# Patient Record
Sex: Female | Born: 1965 | Race: White | Hispanic: No | Marital: Married | Smoking: Current every day smoker
Health system: Southern US, Community
[De-identification: ages and names within clinical notes are randomized; demographics above are authoritative.]

## PROBLEM LIST (undated history)

## (undated) DIAGNOSIS — G9050 Complex regional pain syndrome I, unspecified: Secondary | ICD-10-CM

## (undated) DIAGNOSIS — F32A Depression, unspecified: Secondary | ICD-10-CM

## (undated) DIAGNOSIS — E278 Other specified disorders of adrenal gland: Secondary | ICD-10-CM

## (undated) DIAGNOSIS — I10 Essential (primary) hypertension: Secondary | ICD-10-CM

## (undated) DIAGNOSIS — K219 Gastro-esophageal reflux disease without esophagitis: Secondary | ICD-10-CM

## (undated) DIAGNOSIS — F419 Anxiety disorder, unspecified: Secondary | ICD-10-CM

## (undated) DIAGNOSIS — E279 Disorder of adrenal gland, unspecified: Secondary | ICD-10-CM

## (undated) DIAGNOSIS — T7840XA Allergy, unspecified, initial encounter: Secondary | ICD-10-CM

## (undated) DIAGNOSIS — F4310 Post-traumatic stress disorder, unspecified: Secondary | ICD-10-CM

## (undated) DIAGNOSIS — M797 Fibromyalgia: Secondary | ICD-10-CM

## (undated) HISTORY — DX: Allergy, unspecified, initial encounter: T78.40XA

## (undated) HISTORY — DX: Fibromyalgia: M79.7

## (undated) HISTORY — PX: ABDOMINAL HYSTERECTOMY: SHX81

## (undated) HISTORY — PX: GASTRIC BYPASS: SHX52

## (undated) HISTORY — DX: Post-traumatic stress disorder, unspecified: F43.10

## (undated) HISTORY — DX: Other specified disorders of adrenal gland: E27.8

## (undated) HISTORY — PX: CHOLECYSTECTOMY: SHX55

## (undated) HISTORY — DX: Disorder of adrenal gland, unspecified: E27.9

## (undated) HISTORY — DX: Anxiety disorder, unspecified: F41.9

## (undated) HISTORY — PX: TONSILLECTOMY: SUR1361

## (undated) HISTORY — PX: APPENDECTOMY: SHX54

## (undated) HISTORY — PX: FOOT SURGERY: SHX648

## (undated) HISTORY — DX: Depression, unspecified: F32.A

## (undated) HISTORY — DX: Complex regional pain syndrome I, unspecified: G90.50

---

## 2009-01-18 ENCOUNTER — Ambulatory Visit: Payer: Self-pay | Admitting: Internal Medicine

## 2009-05-22 ENCOUNTER — Ambulatory Visit: Payer: Self-pay | Admitting: Internal Medicine

## 2009-06-16 ENCOUNTER — Encounter: Payer: Self-pay | Admitting: Specialist

## 2009-06-17 ENCOUNTER — Encounter: Payer: Self-pay | Admitting: Specialist

## 2009-07-02 ENCOUNTER — Ambulatory Visit: Payer: Self-pay | Admitting: Podiatry

## 2009-07-29 ENCOUNTER — Ambulatory Visit: Payer: Self-pay | Admitting: Podiatry

## 2009-08-24 ENCOUNTER — Ambulatory Visit: Payer: Self-pay | Admitting: Podiatry

## 2009-09-14 ENCOUNTER — Ambulatory Visit: Payer: Self-pay | Admitting: Internal Medicine

## 2009-11-02 ENCOUNTER — Encounter: Payer: Self-pay | Admitting: Podiatry

## 2009-11-17 ENCOUNTER — Encounter: Payer: Self-pay | Admitting: Podiatry

## 2009-12-15 ENCOUNTER — Encounter: Payer: Self-pay | Admitting: Podiatry

## 2010-01-13 ENCOUNTER — Ambulatory Visit: Payer: Self-pay | Admitting: Pain Medicine

## 2010-01-14 ENCOUNTER — Ambulatory Visit: Payer: Self-pay | Admitting: Pain Medicine

## 2010-01-15 ENCOUNTER — Encounter: Payer: Self-pay | Admitting: Podiatry

## 2010-01-28 ENCOUNTER — Ambulatory Visit: Payer: Self-pay | Admitting: Pain Medicine

## 2010-02-01 ENCOUNTER — Ambulatory Visit: Payer: Self-pay | Admitting: Podiatry

## 2010-02-14 ENCOUNTER — Encounter: Payer: Self-pay | Admitting: Podiatry

## 2010-02-18 ENCOUNTER — Ambulatory Visit: Payer: Self-pay | Admitting: Pain Medicine

## 2010-03-04 ENCOUNTER — Ambulatory Visit: Payer: Self-pay | Admitting: Pain Medicine

## 2010-03-17 ENCOUNTER — Encounter: Payer: Self-pay | Admitting: Podiatry

## 2010-03-18 ENCOUNTER — Ambulatory Visit: Payer: Self-pay | Admitting: Pain Medicine

## 2010-04-01 ENCOUNTER — Ambulatory Visit: Payer: Self-pay | Admitting: Pain Medicine

## 2010-04-06 ENCOUNTER — Ambulatory Visit: Payer: Self-pay | Admitting: Pain Medicine

## 2010-04-15 ENCOUNTER — Ambulatory Visit: Payer: Self-pay | Admitting: Pain Medicine

## 2010-05-08 ENCOUNTER — Inpatient Hospital Stay: Payer: Self-pay | Admitting: *Deleted

## 2011-04-22 DIAGNOSIS — E782 Mixed hyperlipidemia: Secondary | ICD-10-CM | POA: Insufficient documentation

## 2011-05-05 ENCOUNTER — Emergency Department: Payer: Self-pay | Admitting: Emergency Medicine

## 2011-07-20 ENCOUNTER — Encounter: Payer: Self-pay | Admitting: Orthopedic Surgery

## 2011-08-18 ENCOUNTER — Encounter: Payer: Self-pay | Admitting: Orthopedic Surgery

## 2011-12-15 ENCOUNTER — Encounter: Payer: Self-pay | Admitting: Orthopedic Surgery

## 2011-12-16 ENCOUNTER — Encounter: Payer: Self-pay | Admitting: Orthopedic Surgery

## 2012-01-16 ENCOUNTER — Encounter: Payer: Self-pay | Admitting: Orthopedic Surgery

## 2012-11-20 ENCOUNTER — Encounter: Payer: Self-pay | Admitting: Orthopedic Surgery

## 2012-12-15 ENCOUNTER — Encounter: Payer: Self-pay | Admitting: Orthopedic Surgery

## 2013-05-29 DIAGNOSIS — G90521 Complex regional pain syndrome I of right lower limb: Secondary | ICD-10-CM | POA: Insufficient documentation

## 2013-05-29 DIAGNOSIS — M797 Fibromyalgia: Secondary | ICD-10-CM | POA: Insufficient documentation

## 2013-06-05 ENCOUNTER — Ambulatory Visit: Payer: Self-pay | Admitting: Orthopaedic Surgery

## 2014-02-17 DIAGNOSIS — Z9884 Bariatric surgery status: Secondary | ICD-10-CM | POA: Insufficient documentation

## 2014-06-17 ENCOUNTER — Encounter: Payer: Self-pay | Admitting: Family Medicine

## 2014-07-17 ENCOUNTER — Encounter: Payer: Self-pay | Admitting: Family Medicine

## 2015-05-12 ENCOUNTER — Ambulatory Visit
Admission: RE | Admit: 2015-05-12 | Discharge: 2015-05-12 | Disposition: A | Discharge status: Home / Self Care | Payer: BLUE CROSS/BLUE SHIELD | Source: Ambulatory Visit | Attending: Family Medicine | Admitting: Family Medicine

## 2015-05-12 ENCOUNTER — Other Ambulatory Visit: Payer: Self-pay | Admitting: Family Medicine

## 2015-05-12 DIAGNOSIS — M7989 Other specified soft tissue disorders: Secondary | ICD-10-CM | POA: Insufficient documentation

## 2015-05-12 DIAGNOSIS — M79662 Pain in left lower leg: Secondary | ICD-10-CM

## 2015-08-08 ENCOUNTER — Encounter: Payer: Self-pay | Admitting: *Deleted

## 2015-08-08 ENCOUNTER — Ambulatory Visit (INDEPENDENT_AMBULATORY_CARE_PROVIDER_SITE_OTHER)
Admission: EM | Admit: 2015-08-08 | Discharge: 2015-08-08 | Disposition: A | Discharge status: Other Acute Inpatient Hospital | Payer: 59 | Source: Home / Self Care | Attending: Emergency Medicine | Admitting: Emergency Medicine

## 2015-08-08 ENCOUNTER — Other Ambulatory Visit: Payer: Self-pay

## 2015-08-08 ENCOUNTER — Emergency Department: Payer: 59

## 2015-08-08 ENCOUNTER — Encounter: Payer: Self-pay | Admitting: Gynecology

## 2015-08-08 ENCOUNTER — Emergency Department
Admission: EM | Admit: 2015-08-08 | Discharge: 2015-08-08 | Disposition: A | Discharge status: Home / Self Care | Payer: 59 | Attending: Emergency Medicine | Admitting: Emergency Medicine

## 2015-08-08 DIAGNOSIS — R42 Dizziness and giddiness: Secondary | ICD-10-CM | POA: Insufficient documentation

## 2015-08-08 DIAGNOSIS — Z720 Tobacco use: Secondary | ICD-10-CM | POA: Diagnosis not present

## 2015-08-08 DIAGNOSIS — I10 Essential (primary) hypertension: Secondary | ICD-10-CM | POA: Insufficient documentation

## 2015-08-08 DIAGNOSIS — R079 Chest pain, unspecified: Secondary | ICD-10-CM | POA: Insufficient documentation

## 2015-08-08 DIAGNOSIS — Z880 Allergy status to penicillin: Secondary | ICD-10-CM | POA: Diagnosis not present

## 2015-08-08 DIAGNOSIS — F419 Anxiety disorder, unspecified: Secondary | ICD-10-CM | POA: Insufficient documentation

## 2015-08-08 HISTORY — DX: Essential (primary) hypertension: I10

## 2015-08-08 LAB — CBC
HEMATOCRIT: 39.5 % (ref 35.0–47.0)
HEMOGLOBIN: 13.4 g/dL (ref 12.0–16.0)
MCH: 31.5 pg (ref 26.0–34.0)
MCHC: 33.9 g/dL (ref 32.0–36.0)
MCV: 92.7 fL (ref 80.0–100.0)
Platelets: 211 10*3/uL (ref 150–440)
RBC: 4.26 MIL/uL (ref 3.80–5.20)
RDW: 13 % (ref 11.5–14.5)
WBC: 8.8 10*3/uL (ref 3.6–11.0)

## 2015-08-08 LAB — BASIC METABOLIC PANEL
ANION GAP: 6 (ref 5–15)
BUN: 12 mg/dL (ref 6–20)
CHLORIDE: 109 mmol/L (ref 101–111)
CO2: 25 mmol/L (ref 22–32)
Calcium: 8.8 mg/dL — ABNORMAL LOW (ref 8.9–10.3)
Creatinine, Ser: 0.68 mg/dL (ref 0.44–1.00)
GFR calc Af Amer: >60 mL/min (ref >60)
GFR calc non Af Amer: >60 mL/min (ref >60)
Glucose, Bld: 88 mg/dL (ref 65–99)
POTASSIUM: 4 mmol/L (ref 3.5–5.1)
SODIUM: 140 mmol/L (ref 135–145)

## 2015-08-08 LAB — TROPONIN I
Troponin I: <0.03 ng/mL (ref <0.031)
Troponin I: <0.03 ng/mL (ref <0.031)

## 2015-08-08 MED ORDER — IBUPROFEN 600 MG PO TABS
600.0000 mg | ORAL_TABLET | Freq: Once | ORAL | Status: AC
  Administered 2015-08-08: 600 mg via ORAL
  Filled 2015-08-08: qty 1

## 2015-08-08 MED ORDER — ASPIRIN 81 MG PO CHEW
324.0000 mg | CHEWABLE_TABLET | Freq: Once | ORAL | Status: AC
  Administered 2015-08-08: 324 mg via ORAL

## 2015-08-08 NOTE — ED Notes (Signed)
Patient c/o blood pressure at home 160/100. Lightheaded / dizzy / chest soar ness / pressure x this am.

## 2015-08-08 NOTE — ED Notes (Signed)
Per EMS report, patient [facility] for c/o left side chest pressure that radiates to the neck. Patient states she had 4-5 5hr energy drinks yesterday and one today. Patient states she drank the energy drink at 7 am and noticed the pressure around 8-830 am. Patient states she took her blood pressure at home and had a pressure 160/100.

## 2015-08-08 NOTE — ED Provider Notes (Addendum)
Went to evaluate the patient, patient currently is at x-ray. It is likely that I partner Dr. Carollee MassedKaminski will see the patient when she returns.  Sharyn CreamerMark Quale, MD 08/08/15 1149   EKG Time: 1140 Suspect the patient's arm leads may be reversed on the initial twelve-lead, nurse has been asked to repeat once back from x-ray.   Sharyn CreamerMark Quale, MD 08/08/15 [phone]

## 2015-08-08 NOTE — ED Provider Notes (Signed)
[facility] Provider Note  ____________________________________________  Time seen: 12:15 PM  I have reviewed the triage vital signs and the nursing notes.   HISTORY  Chief Complaint Chest Pain    HPI Donna Liu is a 49 y.o. female who presents with complaints of chest discomfort and dizziness. Patient reports she felt well this morning when she got up out of bed and had a 5 hour energy (she does report she had far more of this energy drink than usual in the past 24 hours). After this she decided to go back to bed and felt slightly dizzy and lightheaded. She felt some mild chest discomfort in the left upper chest which was cramping in nature and which has since resolved. Because of this she went to urgent care and they referred her to the emergency department. She is chest pain-free in the emergency department. She feels well overall. No recent travel. No calf swelling or pain. No history of heart disease. She does smoke cigarettes     Past Medical History  Diagnosis Date  . Hypertension     There are no active problems to display for this patient.   Past Surgical History  Procedure Laterality Date  . Tonsillectomy    . Cholecystectomy    . Abdominal hysterectomy    . Foot surgery Right   . Gastric bypass      No current outpatient prescriptions on file.  Allergies Penicillins and Sulfur  No family history on file.  Social History Social History  Substance Use Topics  . Smoking status: Current Every Day Smoker -- 0.50 packs/day    Types: Cigarettes  . Smokeless tobacco: None  . Alcohol Use: No    Review of Systems  Constitutional: Negative for fever. Eyes: Negative for visual changes. ENT: Negative for sore throat Cardiovascular: Positive for chest pain. Respiratory: Negative for shortness of breath. Gastrointestinal: Negative for abdominal pain, vomiting and diarrhea. Genitourinary: Negative for  dysuria. Musculoskeletal: Negative for back pain. Skin: Negative for rash. Neurological: Negative for headaches or focal weakness Psychiatric: Mild anxiety    ____________________________________________   PHYSICAL EXAM:  VITAL SIGNS: ED Triage Vitals  Enc Vitals Group     BP 08/08/15 1117 141/88 mmHg     Pulse Rate 08/08/15 1117 65     Resp 08/08/15 1117 18     Temp 08/08/15 1117 98.5 F (36.9 C)     Temp Source 08/08/15 1117 Oral     SpO2 08/08/15 1117 96 %     Weight 08/08/15 1117 166 lb (75.297 kg)     Height 08/08/15 1117  (1.753 m)     Head Cir --      Peak Flow --      Pain Score 08/08/15 1120 2     Pain Loc --      Pain Edu? --      Excl. in GC? --      Constitutional: Alert and oriented. Well appearing and in no distress. Eyes: Conjunctivae are normal.  ENT   Head: Normocephalic and atraumatic.   Mouth/Throat: Mucous membranes are moist. Cardiovascular: Normal rate, regular rhythm. Normal and symmetric distal pulses are present in all extremities. No murmurs, rubs, or gallops. Respiratory: Normal respiratory effort without tachypnea nor retractions. Breath sounds are clear and equal bilaterally.  Gastrointestinal: Soft and non-tender in all quadrants. No distention. There is no CVA tenderness. Genitourinary: deferred Musculoskeletal: Nontender with normal range of motion in all extremities. No lower  extremity tenderness nor edema. Neurologic:  Normal speech and language. No gross focal neurologic deficits are appreciated. Skin:  Skin is warm, dry and intact. No rash noted. Psychiatric: Mood and affect are normal. Patient exhibits appropriate insight and judgment.  ____________________________________________    LABS (pertinent positives/negatives)  Labs Reviewed  BASIC METABOLIC PANEL - Abnormal; Notable for the following:    Calcium 8.8 (*)    All other components within normal limits  CBC  TROPONIN I     ____________________________________________   EKG  ED ECG REPORT I, Jene EveryKINNER, Colette Dicamillo, the attending physician, personally viewed and interpreted this ECG.  Date: 08/08/2015 EKG Time: 11:54 AM Rate: 62 Rhythm: normal sinus rhythm QRS Axis: normal Intervals: normal ST/T Wave abnormalities: normal Conduction Disutrbances: none Narrative Interpretation: unremarkable   ____________________________________________    RADIOLOGY I have personally reviewed any xrays that were ordered on this patient: No acute abnormalities  ____________________________________________   PROCEDURES  Procedure(s) performed: none  Critical Care performed: none  ____________________________________________   INITIAL IMPRESSION / ASSESSMENT AND PLAN / ED COURSE  Pertinent labs & imaging results that were available during my care of the patient were reviewed by me and considered in my medical decision making (see chart for details).  Patient well-appearing and in no distress. Her EKG is normal. Her first troponin is normal and her labs are otherwise unremarkable. She feels well and has no chest pain. Suspicious as is the patient that  overuse of energy drinks today may have caused  her dizziness this morning. Given history of chest pain we will recheck a troponin. However this does not seem consistent with ACS to me. Suspect follow-up with cardiology would be appropriate if normal second enzymes  ----------------------------------------- 2:18 PM on 08/08/2015 -----------------------------------------  Second troponin negative. Patient remains chest pain-free. All vitals normal. I discussed with patient the need for follow-up with cardiology she agrees to see Dr. Lady GaryFath. Return precautions discussed including immediate return to the emergency department for any chest pain.  ____________________________________________   FINAL CLINICAL IMPRESSION(S) / ED DIAGNOSES  Final diagnoses:  Chest  pain, unspecified chest pain type     Jene Everyobert Dquan Cortopassi, MD 08/08/15 1419

## 2015-08-08 NOTE — ED Provider Notes (Addendum)
HPI  SUBJECTIVE:  Donna Liu is a 49 y.o. female who presents with intermittent lightheadedness/dizziness that last seconds that seems to be associated with rapid change in position. There are no other aggravating or alleviating factors. She has not tried anything for this. She also reports intermittent chest pain that she describes as tightness/pressure with radiation to her left neck that  accompanies this lightheadedness/dizziness. There are no other aggravating or alleviating factors for this chest pain she has not tried anything for this. She recently recovered from UTI, was on 5 days of Cipro which she finished yesterday. She denies nausea, vomiting, fevers. No diarrhea. States that she has been eating and drinking well. No diaphoresis when having the chest pain. No visual changes, headache, dysarthria, arm or leg weakness, facial droop. No shortness of breath, cough, wheeze, abdominal pain, presyncope, syncope. She is not on any other medications. Past medical history includes history of hypertension, history of diabetes, now resolved after gastric bypass surgery/weight loss. She is a smoker, history of hypercholesterolemia. No history of stroke, arrhythmia, MI, angina. No history of MI, stroke.  Past Medical History  Diagnosis Date  . Hypertension     Past Surgical History  Procedure Laterality Date  . Tonsillectomy    . Cholecystectomy    . Abdominal hysterectomy    . Foot surgery Right     No family history on file.  Social History  Substance Use Topics  . Smoking status: Current Every Day Smoker -- 0.50 packs/day    Types: Cigarettes  . Smokeless tobacco: None  . Alcohol Use: No     Current facility-administered medications:  .  aspirin chewable tablet 324 mg, 324 mg, Oral, Once, Domenick GongAshley Shira Bobst, MD No current outpatient prescriptions on file.  Allergies  Allergen Reactions  . Penicillins Anaphylaxis  . Sulfur Anaphylaxis     ROS  As noted in HPI.    Physical Exam  BP 131/76 mmHg  Pulse 79  Temp(Src) 97.6 F (36.4 C) (Oral)  Resp 18  Ht 5\' 9"  (1.753 m)  Wt 166 lb (75.297 kg)  BMI 24.50 kg/m2  SpO2 99%  Constitutional: Well developed, well nourished, no acute distress Eyes: PERRL, EOMI, conjunctiva normal bilaterally HENT: Normocephalic, atraumatic,mucus membranes moist Respiratory: Clear to auscultation bilaterally, no rales, no wheezing, no rhonchi Cardiovascular: Normal rate and rhythm, no murmurs, no gallops, no rubs. No carotid bruit. GI: Soft, nondistended, normal bowel sounds, nontender, no rebound, no guarding Back: no CVAT skin: No rash, skin intact Musculoskeletal: No edema, no tenderness, no deformities Neurologic: Alert & oriented x 3, CN II-XII , finger-nose, heel shin within normal limits, tandem gait steady, grip strength, upper extremity and lower extremity 5/5 equal bilaterally., no motor deficits, sensation grossly intact Psychiatric: Speech and behavior appropriate   ED Course   Medications  aspirin chewable tablet 324 mg (not administered)    Orders Placed This Encounter  Procedures  . ED EKG    Chest heaviness; dizzy    Standing Status: Standing     Number of Occurrences: 1     Standing Expiration Date:     Order Specific Question:  Reason for Exam    Answer:  Other (See Comments)   No results found for this or any previous visit (from the past 24 hour(s)). No results found.  ED Clinical Impression  Lightheadedness  Chest pain, unspecified chest pain type   ED Assessment/Plan  EKG: Normal sinus rhythm, normal axis, normal intervals. No hypertrophy. No ST-T wave changes, when  compared to EKG from August 2012  Patient neurologically intact, does not appear to be a stroke. Concern for cardiac cause of her symptoms given the chest tightness with radiation to her neck while having the symptoms. Vitals are otherwise normal. No evidence of hypertensive emergency. Gave aspirin 324 mg by  mouth 1. Transferring to the ED via via EMS further workup.  *This clinic note was created using Dragon dictation software. Therefore, there may be occasional mistakes despite careful proofreading.    Domenick Gong, MD 08/08/15 1039  Domenick Gong, MD 08/08/15 1040

## 2015-08-08 NOTE — Discharge Instructions (Signed)
Nonspecific Chest Pain  °Chest pain can be caused by many different conditions. There is always a chance that your pain could be related to something serious, such as a heart attack or a blood clot in your lungs. Chest pain can also be caused by conditions that are not life-threatening. If you have chest pain, it is very important to follow up with your health care provider. °CAUSES  °Chest pain can be caused by: °· Heartburn. °· Pneumonia or bronchitis. °· Anxiety or stress. °· Inflammation around your heart (pericarditis) or lung (pleuritis or pleurisy). °· A blood clot in your lung. °· A collapsed lung (pneumothorax). It can develop suddenly on its own (spontaneous pneumothorax) or from trauma to the chest. °· Shingles infection (varicella-zoster virus). °· Heart attack. °· Damage to the bones, muscles, and cartilage that make up your chest wall. This can include: °¨ Bruised bones due to injury. °¨ Strained muscles or cartilage due to frequent or repeated coughing or overwork. °¨ Fracture to one or more ribs. °¨ Sore cartilage due to inflammation (costochondritis). °RISK FACTORS  °Risk factors for chest pain may include: °· Activities that increase your risk for trauma or injury to your chest. °· Respiratory infections or conditions that cause frequent coughing. °· Medical conditions or overeating that can cause heartburn. °· Heart disease or family history of heart disease. °· Conditions or health behaviors that increase your risk of developing a blood clot. °· Having had chicken pox (varicella zoster). °SIGNS AND SYMPTOMS °Chest pain can feel like: °· Burning or tingling on the surface of your chest or deep in your chest. °· Crushing, pressure, aching, or squeezing pain. °· Dull or sharp pain that is worse when you move, cough, or take a deep breath. °· Pain that is also felt in your back, neck, shoulder, or arm, or pain that spreads to any of these areas. °Your chest pain may come and go, or it may stay  constant. °DIAGNOSIS °Lab tests or other studies may be needed to find the cause of your pain. Your health care provider may have you take a test called an ambulatory ECG (electrocardiogram). An ECG records your heartbeat patterns at the time the test is performed. You may also have other tests, such as: °· Transthoracic echocardiogram (TTE). During echocardiography, sound waves are used to create a picture of all of the heart structures and to look at how blood flows through your heart. °· Transesophageal echocardiogram (TEE). This is a more advanced imaging test that obtains images from inside your body. It allows your health care provider to see your heart in finer detail. °· Cardiac monitoring. This allows your health care provider to monitor your heart rate and rhythm in real time. °· Holter monitor. This is a portable device that records your heartbeat and can help to diagnose abnormal heartbeats. It allows your health care provider to track your heart activity for several days, if needed. °· Stress tests. These can be done through exercise or by taking medicine that makes your heart beat more quickly. °· Blood tests. °· Imaging tests. °TREATMENT  °Your treatment depends on what is causing your chest pain. Treatment may include: °· Medicines. These may include: °¨ Acid blockers for heartburn. °¨ Anti-inflammatory medicine. °¨ Pain medicine for inflammatory conditions. °¨ Antibiotic medicine, if an infection is present. °¨ Medicines to dissolve blood clots. °¨ Medicines to treat coronary artery disease. °· Supportive care for conditions that do not require medicines. This may include: °¨ Resting. °¨ Applying heat   or cold packs to injured areas. °¨ Limiting activities until pain decreases. °HOME CARE INSTRUCTIONS °· If you were prescribed an antibiotic medicine, finish it all even if you start to feel better. °· Avoid any activities that bring on chest pain. °· Do not use any tobacco products, including  cigarettes, chewing tobacco, or electronic cigarettes. If you need help quitting, ask your health care provider. °· Do not drink alcohol. °· Take medicines only as directed by your health care provider. °· Keep all follow-up visits as directed by your health care provider. This is important. This includes any further testing if your chest pain does not go away. °· If heartburn is the cause for your chest pain, you may be told to keep your head raised (elevated) while sleeping. This reduces the chance that acid will go from your stomach into your esophagus. °· Make lifestyle changes as directed by your health care provider. These may include: °¨ Getting regular exercise. Ask your health care provider to suggest some activities that are safe for you. °¨ Eating a heart-healthy diet. A registered dietitian can help you to learn healthy eating options. °¨ Maintaining a healthy weight. °¨ Managing diabetes, if necessary. °¨ Reducing stress. °SEEK MEDICAL CARE IF: °· Your chest pain does not go away after treatment. °· You have a rash with blisters on your chest. °· You have a fever. °SEEK IMMEDIATE MEDICAL CARE IF:  °· Your chest pain is worse. °· You have an increasing cough, or you cough up blood. °· You have severe abdominal pain. °· You have severe weakness. °· You faint. °· You have chills. °· You have sudden, unexplained chest discomfort. °· You have sudden, unexplained discomfort in your arms, back, neck, or jaw. °· You have shortness of breath at any time. °· You suddenly start to sweat, or your skin gets clammy. °· You feel nauseous or you vomit. °· You suddenly feel light-headed or dizzy. °· Your heart begins to beat quickly, or it feels like it is skipping beats. °These symptoms may represent a serious problem that is an emergency. Do not wait to see if the symptoms will go away. Get medical help right away. Call your local emergency services (911 in the [place] Do not drive yourself to the hospital. °  °This  information is not intended to replace advice given to you by your health care provider. Make sure you discuss any questions you have with your health care provider. °  °Document Released: 07/13/2005 Document Revised: 10/24/2014 Document Reviewed: 05/09/2014 °Elsevier Interactive Patient Education ©2016 [employer] ° °

## 2015-08-21 ENCOUNTER — Other Ambulatory Visit: Payer: Self-pay | Admitting: Internal Medicine

## 2015-08-21 DIAGNOSIS — E278 Other specified disorders of adrenal gland: Secondary | ICD-10-CM

## 2015-08-21 DIAGNOSIS — E279 Disorder of adrenal gland, unspecified: Principal | ICD-10-CM

## 2015-08-26 ENCOUNTER — Ambulatory Visit: Payer: 59

## 2015-08-31 ENCOUNTER — Ambulatory Visit
Admission: RE | Admit: 2015-08-31 | Discharge: 2015-08-31 | Disposition: A | Discharge status: Home / Self Care | Payer: 59 | Source: Ambulatory Visit | Attending: Internal Medicine | Admitting: Internal Medicine

## 2015-08-31 DIAGNOSIS — E279 Disorder of adrenal gland, unspecified: Secondary | ICD-10-CM | POA: Diagnosis not present

## 2015-08-31 DIAGNOSIS — Z9049 Acquired absence of other specified parts of digestive tract: Secondary | ICD-10-CM | POA: Insufficient documentation

## 2015-08-31 DIAGNOSIS — Z9884 Bariatric surgery status: Secondary | ICD-10-CM | POA: Diagnosis not present

## 2015-08-31 DIAGNOSIS — E278 Other specified disorders of adrenal gland: Secondary | ICD-10-CM

## 2015-11-24 ENCOUNTER — Ambulatory Visit
Admission: EM | Admit: 2015-11-24 | Discharge: 2015-11-24 | Disposition: A | Discharge status: Home / Self Care | Payer: 59 | Attending: Family Medicine | Admitting: Family Medicine

## 2015-11-24 DIAGNOSIS — IMO0001 Reserved for inherently not codable concepts without codable children: Secondary | ICD-10-CM

## 2015-11-24 DIAGNOSIS — K529 Noninfective gastroenteritis and colitis, unspecified: Secondary | ICD-10-CM

## 2015-11-24 DIAGNOSIS — R030 Elevated blood-pressure reading, without diagnosis of hypertension: Secondary | ICD-10-CM

## 2015-11-24 DIAGNOSIS — A059 Bacterial foodborne intoxication, unspecified: Secondary | ICD-10-CM | POA: Diagnosis not present

## 2015-11-24 HISTORY — DX: Gastro-esophageal reflux disease without esophagitis: K21.9

## 2015-11-24 LAB — URINALYSIS COMPLETE WITH MICROSCOPIC (ARMC ONLY)
Bacteria, UA: NONE SEEN
Bilirubin Urine: NEGATIVE
GLUCOSE, UA: NEGATIVE mg/dL
HGB URINE DIPSTICK: NEGATIVE
Ketones, ur: NEGATIVE mg/dL
LEUKOCYTES UA: NEGATIVE
NITRITE: NEGATIVE
PH: 7 (ref 5.0–8.0)
PROTEIN: NEGATIVE mg/dL
RBC / HPF: NONE SEEN RBC/hpf (ref 0–5)
SPECIFIC GRAVITY, URINE: 1.01 (ref 1.005–1.030)
Squamous Epithelial / LPF: NONE SEEN
WBC UA: NONE SEEN WBC/hpf (ref 0–5)

## 2015-11-24 MED ORDER — ONDANSETRON 8 MG PO TBDP: 8.0000 mg | ORAL_TABLET | Freq: Three times a day (TID) | ORAL | Status: DC | PRN

## 2015-11-24 NOTE — Discharge Instructions (Signed)
Food Poisoning Food poisoning is an illness caused by something you ate or drank. It usually lasts 1 to 2 days. Problems may be worse for people with low immune systems, the elderly, children and infants, and pregnant women. HOME CARE  Drink enough water and fluids to keep your pee (urine) clear or pale yellow. Drink small amounts often.  Ask your doctor how to replace body fluid losses (rehydration).  Avoid:  Foods high in sugar.  Alcohol.  Bubbly (carbonated) drinks.  Tobacco.  Juice.  Caffeine drinks.  Very hot or cold fluids.  Fatty, greasy foods.  Eating too much at one time.  Dairy products until 24 to 48 hours after watery poop (diarrhea) stops.  You may eat foods with active cultures (probiotics). They can be found in some yogurts and supplements.  Wash your hands well to avoid spreading the illness.  Only take medicines as told by your doctor. Do not give aspirin to children.  Ask your doctor if you should keep taking your regular medicines. GET HELP RIGHT AWAY IF:   You have trouble breathing, swallowing, talking, or moving.  You have blurry vision.  You cannot keep fluids down.  You pass out (faint) or almost pass out.  Your eyes turn yellow.  You keep throwing up (vomiting) or having watery poop.  Belly (abdominal) pain starts, gets worse, or is just in one small spot (localizes).  You have a fever.  Your watery poop has blood in it.  You feel very weak, dizzy, or thirsty.  You do not pee for 8 hours. MAKE SURE YOU:   Understand these instructions.  Will watch your condition.  Will get help right away if you are not doing well or get worse.   This information is not intended to replace advice given to you by your health care provider. Make sure you discuss any questions you have with your health care provider.   Document Released: 03/23/2010 Document Revised: 12/26/2011 Document Reviewed: 04/06/2015 Elsevier Interactive Patient  Education 2016 [employer].  Hypertension Hypertension is another name for high blood pressure. High blood pressure forces your heart to work harder to pump blood. A blood pressure reading has two numbers, which includes a higher number over a lower number (example: 110/72). HOME CARE   Have your blood pressure rechecked by your doctor.  Only take medicine as told by your doctor. Follow the directions carefully. The medicine does not work as well if you skip doses. Skipping doses also puts you at risk for problems.  Do not smoke.  Monitor your blood pressure at home as told by your doctor. GET HELP IF:  You think you are having a reaction to the medicine you are taking.  You have repeat headaches or feel dizzy.  You have puffiness (swelling) in your ankles.  You have trouble with your vision. GET HELP RIGHT AWAY IF:   You get a very bad headache and are confused.  You feel weak, numb, or faint.  You get chest or belly (abdominal) pain.  You throw up (vomit).  You cannot breathe very well. MAKE SURE YOU:   Understand these instructions.  Will watch your condition.  Will get help right away if you are not doing well or get worse.   This information is not intended to replace advice given to you by your health care provider. Make sure you discuss any questions you have with your health care provider.   Document Released: 03/21/2008 Document Revised: 10/08/2013 Document Reviewed:  07/26/2013 Elsevier Interactive Patient Education [employer]

## 2015-11-24 NOTE — ED Provider Notes (Signed)
CSN: [id number]     Arrival date & time 11/24/15  1110 History   First MD Initiated Contact with Patient 11/24/15 1149    Nurses notes were reviewed. Chief Complaint  Patient presents with  . Hypertension  . Nausea  . Emesis   Patient reports having a history of elevated blood pressure. She had bypass surgery to lose weight and she's lost 50 pounds. States they took off her blood pressure medicine at that time since that she's had multiple episodes where blood pressures been elevated. She was also found to have adrenal nodules and apparently is visiting From 1 she had that followed until recently where was found that the nodules have increased in size. She has multiple nodules-being followed by endocrinologist. Several months ago she was here having chest pain with elevated blood pressure she was sent to emergency room had a stress test and echo found to have mitral valve prolapse. At that time no blood pressure was started. She's had multiple blood pressures have been normal but she is also has some that were elevated. Sunday night she started having nausea vomiting diarrhea and found on Monday her blood pressure was markedly elevated and still elevated today. She states his systolic blood pressure was up to 170/180 and her diastolic was up over 100.  She states Morrie Sheldon gastroneuritis is gotten better today but because of blood pressure still elevated she came in to be seen and evaluated. She states she try to contact her PCP but couldn't be seen today. She did not make appointment to see him in the near future. She is to see Dr. Lady Gary in an October and see her endocrinologist for the adrenal tumors in April. She still smokes and she's had no surgeries including gastric bypass appendectomy abdominal hysterectomy and cholecystectomy and tonsillectomy  Other than her daughter-in-law also having gastroenteritis symptoms she reports no significant family medical history other than hypertension.     location/radiation/quality/duration/timing/severity/associated sxs/prior Treatment) Patient is a 50 y.o. female presenting with hypertension and vomiting. The history is provided by the patient. No language interpreter was used.  Hypertension This is a recurrent problem. The current episode started yesterday. The problem occurs constantly. The problem has not changed since onset.Pertinent negatives include no chest pain, no abdominal pain, no headaches and no shortness of breath. Nothing aggravates the symptoms. Nothing relieves the symptoms. She has tried nothing for the symptoms. The treatment provided no relief.  Emesis Severity:  Moderate Duration:  2 days Timing:  Intermittent Quality:  Unable to specify Progression:  Partially resolved Chronicity:  New Relieved by:  None tried Associated symptoms: no abdominal pain, no headaches and no myalgias   Risk factors: prior abdominal surgery and suspect food intake   Risk factors: no alcohol use, no diabetes, not pregnant now, no sick contacts and no travel to endemic areas     Past Medical History  Diagnosis Date  . Hypertension   . Acid reflux    Past Surgical History  Procedure Laterality Date  . Tonsillectomy    . Cholecystectomy    . Abdominal hysterectomy    . Foot surgery Right   . Gastric bypass    . Appendectomy     History reviewed. No pertinent family history. Social History  Substance Use Topics  . Smoking status: Current Every Day Smoker -- 0.50 packs/day    Types: Cigarettes  . Smokeless tobacco: None  . Alcohol Use: No   OB History    No data available  Review of Systems  Respiratory: Negative for shortness of breath.   Cardiovascular: Negative for chest pain.  Gastrointestinal: Positive for vomiting. Negative for abdominal pain.  Musculoskeletal: Negative for myalgias.  Neurological: Negative for headaches.  All other systems reviewed and are negative.   Allergies  Penicillins; Tapentadol;  and Sulfur  Home Medications   Prior to Admission medications   Medication Sig Start Date End Date Taking? Authorizing Provider  omeprazole (PRILOSEC) 40 MG capsule Take 40 mg by mouth daily. 09/04/14  Yes Historical Provider, MD  ondansetron (ZOFRAN ODT) 8 MG disintegrating tablet Take 1 tablet (8 mg total) by mouth every 8 (eight) hours as needed for nausea or vomiting. 11/24/15   Hassan Rowan, MD   Meds Ordered and Administered this Visit  Medications - No data to display  BP 157/96 mmHg  Pulse 72  Temp(Src) 98 F (36.7 C) (Oral)  Resp 16  Ht  (1.753 m)  Wt 174 lb (78.926 kg)  BMI 25.68 kg/m2  SpO2 100% Orthostatic VS for the past 24 hrs:  BP- Lying Pulse- Lying BP- Sitting Pulse- Sitting BP- Standing at 0 minutes Pulse- Standing at 0 minutes  11/24/15 1145 148/87 mmHg 66 150/87 mmHg 75 (!) 167/105 mmHg 83    Physical Exam  Constitutional: She is oriented to person, place, and time. She appears well-developed and well-nourished.  Playing with her grandson  HENT:  Head: Normocephalic and atraumatic.  Eyes: Conjunctivae are normal. Pupils are equal, round, and reactive to light.  Neck: Normal range of motion. Neck supple.  Cardiovascular: Normal rate, regular rhythm and normal heart sounds.   Pulmonary/Chest: Effort normal and breath sounds normal. No respiratory distress.  Abdominal: Soft. Bowel sounds are normal. She exhibits no distension. There is no tenderness.  Musculoskeletal: Normal range of motion. She exhibits no edema or tenderness.  Lymphadenopathy:    She has no cervical adenopathy.  Neurological: She is alert and oriented to person, place, and time.  Skin: Skin is warm and dry.  Psychiatric: She has a normal mood and affect.  Vitals reviewed.   ED Course  Procedures (including critical care time)  Labs Review Labs Reviewed  URINALYSIS COMPLETEWITH MICROSCOPIC ([facility] ONLY) - Abnormal; Notable for the following:    Color, Urine STRAW (*)    All other  components within normal limits    Imaging Review No results found.   Visual Acuity Review  Right Eye Distance:   Left Eye Distance:   Bilateral Distance:    Right Eye Near:   Left Eye Near:    Bilateral Near:     Results for orders placed or performed during the hospital encounter of 11/24/15  Urinalysis complete, with microscopic  Result Value Ref Range   Color, Urine STRAW (A) YELLOW   APPearance CLEAR CLEAR   Glucose, UA NEGATIVE NEGATIVE mg/dL   Bilirubin Urine NEGATIVE NEGATIVE   Ketones, ur NEGATIVE NEGATIVE mg/dL   Specific Gravity, Urine 1.010 1.005 - 1.030   Hgb urine dipstick NEGATIVE NEGATIVE   pH 7.0 5.0 - 8.0   Protein, ur NEGATIVE NEGATIVE mg/dL   Nitrite NEGATIVE NEGATIVE   Leukocytes, UA NEGATIVE NEGATIVE   RBC / HPF NONE SEEN 0 - 5 RBC/hpf   WBC, UA NONE SEEN 0 - 5 WBC/hpf   Bacteria, UA NONE SEEN NONE SEEN   Squamous Epithelial / LPF NONE SEEN NONE SEEN     MDM   1. Elevated blood pressure   2. Gastroenteritis   3. Food poisoning  At this time the patient having a history of elevation of blood pressure history of adrenal nodules followed and she's not have a repeat CT scan or evaluation April avoid not comfortable starting her on new blood pressure medicine. Strongly suggested she try to contact PCP not elevation is seen particularly treatment to see him for follow-up. Explained to her that I would recommend this time she proximal least 3-4 times a year to keep them down her blood pressure as she sees an endocrinologist for evaluation of adrenal nodules. Should be noted she was seen by Dr. Lady Gary cardiologist who did not feel that blood pressure medicine was needed at that time for mitral valve prolapse. Also when she was here with elevated blood pressuresshe had chest pain she is having no chest pain this time so I wouldn't continue to watch her BP.  As far as a gastroenteritis is concern diarrhea seems to be under control with Imodium abuse with  Zofran she does think this food poisoning was since her daughter same food and fast food place now sick might indicate that the cause of the gastroenteritis. She was worried about going into shock with blood pressure being up explainedUrinalysis was normal to her that if she was going to shock because the gastroenteritis her blood pressure should be actually going down when she stands up not going out. If anything blood pressure going up probably indicates that she's not feeling well and that her body is probably under stress test causing her blood pressure go up. [place] the recent since she is ill not place him blood pressure medicine at this time especially since when she had a URI last week her blood pressure was normal  She declines work note.  U/a WAS NORMAL.  Note: This dictation was prepared with Dragon dictation along with smaller phrase technology. Any transcriptional errors that result from this process are unintentional.    Hassan Rowan, MD 11/24/15 1231

## 2015-11-24 NOTE — ED Notes (Signed)
Patient states that she has been experiencing hypertension, diarrhea, vomiting since since last night after dinner.

## 2016-01-29 DIAGNOSIS — M76891 Other specified enthesopathies of right lower limb, excluding foot: Secondary | ICD-10-CM | POA: Insufficient documentation

## 2016-03-02 ENCOUNTER — Other Ambulatory Visit: Payer: Self-pay | Admitting: Internal Medicine

## 2016-03-02 DIAGNOSIS — E279 Disorder of adrenal gland, unspecified: Principal | ICD-10-CM

## 2016-03-02 DIAGNOSIS — E278 Other specified disorders of adrenal gland: Secondary | ICD-10-CM

## 2016-03-08 ENCOUNTER — Ambulatory Visit
Admission: RE | Admit: 2016-03-08 | Discharge: 2016-03-08 | Disposition: A | Discharge status: Home / Self Care | Payer: 59 | Source: Ambulatory Visit | Attending: Internal Medicine | Admitting: Internal Medicine

## 2016-03-08 DIAGNOSIS — E278 Other specified disorders of adrenal gland: Secondary | ICD-10-CM

## 2016-03-08 DIAGNOSIS — E279 Disorder of adrenal gland, unspecified: Secondary | ICD-10-CM | POA: Insufficient documentation

## 2016-10-03 ENCOUNTER — Ambulatory Visit: Payer: 59 | Attending: Surgery | Admitting: Physical Therapy

## 2016-10-03 ENCOUNTER — Encounter: Payer: Self-pay | Admitting: Physical Therapy

## 2016-10-03 DIAGNOSIS — M25551 Pain in right hip: Secondary | ICD-10-CM | POA: Insufficient documentation

## 2016-10-03 DIAGNOSIS — M6281 Muscle weakness (generalized): Secondary | ICD-10-CM | POA: Diagnosis present

## 2016-10-03 DIAGNOSIS — R269 Unspecified abnormalities of gait and mobility: Secondary | ICD-10-CM | POA: Insufficient documentation

## 2016-10-04 NOTE — Therapy (Addendum)
[facility] [address] [place], [place], [address] Phone: [phone]   Fax:  [phone]  Physical Therapy Evaluation  Patient Details  Name: Donna NorthKathy A Paske MRN: [id number] Date of Birth: 12-Mar-1966 Referring Provider: Dr. Patrcia DollyMoses  Encounter Date: 10/03/2016        PT End of Session - 10/03/16 0831    Visit Number 1   Number of Visits 8   Date for PT Re-Evaluation 10/31/16   PT Start Time 0728   PT Stop Time 0831   PT Time Calculation (min) 63 min   Activity Tolerance Patient tolerated treatment well;Patient limited by pain   Behavior During Therapy [facility] for tasks assessed/performed       Past Medical History:  Diagnosis Date  . Acid reflux   . Hypertension     Past Surgical History:  Procedure Laterality Date  . ABDOMINAL HYSTERECTOMY    . APPENDECTOMY    . CHOLECYSTECTOMY    . FOOT SURGERY Right   . GASTRIC BYPASS    . TONSILLECTOMY      There were no vitals filed for this visit.   Pt. reports 4/10 R hip/lateral leg pain with progressive worsening over past several weeks.  Pt. reports tenderness with light palpation over R hip/ITB.     See HEP    Pt. is a 50 y/o female with chronic c/o R hip pain.  Pt. reports 4/10 R hip/proximal ITB tenderness and pain.  No swelling or brusing noted.  Pt. presents with good ASIS/hip symmetry in supine and standing posture.  Slight R antalgic gait pattern with initial steps.  R hip pain with end-range pirifomis/hip abd. stretches.  (-) Claiborne Riggber.  B LE muscle strength 5/5 MMT with increase pain during R hip flexion/ abd..  Pt. will benefit from short-term skilled PT services to develop ex. program/ decrease R hip tenderness/pain with daily tasks.           PT Long Term Goals - 10/04/16 1418      PT LONG TERM GOAL #1   Title Pt. I with HEP to increase R hip AROM (all planes) to improve pain-free mobility.    Baseline R hip pain with end-range piriformis/ hip abd. stretch.    Time 4   Period Weeks   Status New     PT LONG TERM GOAL #2   Title Pt. will report no R hip/ proximal ITB tenderness with palpation to improve pain-free mobility.    Baseline (+) tenderness over greater trochanter   Time 4   Period Weeks   Status New     PT LONG TERM GOAL #3   Title Pt. able to ambulate with normalized gait pattern and no c/o R hip pain to improve mobility.    Baseline R hip antalgic gait pattern.    Time 4   Period Weeks   Status New        Patient will benefit from skilled therapeutic intervention in order to improve the following deficits and impairments:  Abnormal gait, Pain, Decreased strength, Decreased range of motion, Decreased activity tolerance, Decreased safety awareness, Difficulty walking, Impaired flexibility  Visit Diagnosis: Pain in right hip  Muscle weakness (generalized)  Gait difficulty     Problem List There are no active problems to display for this patient.  Cammie McgeeMichael C Haylee Mcanany, PT, DPT # [id number] 10/04/2016, 11:24 AM  [facility] [address] [place], [place], [address] Phone: [phone]  Fax:  [phone]  Name: Donna NorthKathy A Bucker MRN: [id number] Date of Birth: [birth year]

## 2016-10-05 ENCOUNTER — Ambulatory Visit: Payer: 59 | Admitting: Physical Therapy

## 2016-10-05 DIAGNOSIS — R269 Unspecified abnormalities of gait and mobility: Secondary | ICD-10-CM

## 2016-10-05 DIAGNOSIS — M25551 Pain in right hip: Secondary | ICD-10-CM | POA: Diagnosis not present

## 2016-10-05 DIAGNOSIS — M6281 Muscle weakness (generalized): Secondary | ICD-10-CM

## 2016-10-06 NOTE — Therapy (Addendum)
[place] [facility] [address] [place], [place], [address] Phone: [phone]   Fax:  [phone]  Physical Therapy Treatment  Patient Details  Name: Donna NorthKathy A Liu MRN: [id number] Date of Birth: [birth year] Referring Provider: Dr. Patrcia DollyMoses  Encounter Date: 10/05/2016      PT End of Session - 10/06/16 1258    Visit Number 2   Number of Visits 8   Date for PT Re-Evaluation 10/31/16   PT Start Time 1621   PT Stop Time 1718   PT Time Calculation (min) 57 min   Activity Tolerance Patient tolerated treatment well;Patient limited by pain   Behavior During Therapy [facility] for tasks assessed/performed      Past Medical History:  Diagnosis Date  . Acid reflux   . Hypertension     Past Surgical History:  Procedure Laterality Date  . ABDOMINAL HYSTERECTOMY    . APPENDECTOMY    . CHOLECYSTECTOMY    . FOOT SURGERY Right   . GASTRIC BYPASS    . TONSILLECTOMY      There were no vitals filed for this visit.    Pt. has a few questions about supine hip stretches.  Pt. c/o 3/10 R hip pain currently at rest/walking into PT clinic.      OBJECTIVE:  Scifit L5 10 min. (warm-up/ no charge).  Discussed HEP.  Supine hip abd./ flexion with GTB and adduction with ball 20x each.  Supine TrA ex.: bridging with hold/ trunk rotn.  Manual tx.:  STM to R hip/ ITB in supine and L sidelying position.  Supine LE/lumbar stretches.  LAD to R hip in supine 5x 20 sec.  Ice to R hip in L sidelying after tx. Session.      Pt response for medical necessity:  Benefits from R hip stretches to improve mobility and progress to more stability ex. With HEP.  Pt. Remains tender at this time and PT will reassess next tx. And progress HEP as tolerated.       (+) R hip tenderness over greater trochanter and distal into ITB to R lateral aspect of knee.  Improvement in R hip/back flexibility after supine manual stretches.  No change to HEP at this time and will progress to  more stability ex. once tenderness improves.           Plan - 10/06/16 1258    Rehab Potential Good   PT Frequency 2x / week   PT Duration 4 weeks   PT Treatment/Interventions Cryotherapy;Electrical Stimulation;Moist Heat;Therapeutic exercise;Therapeutic activities;Functional mobility training;Stair training;Gait training;Balance training;Neuromuscular re-education;Patient/family education;Manual techniques;Passive range of motion;Dry needling   PT Next Visit Plan R hip mobs./ glut med. and hip stability ex.    PT Home Exercise Plan See handouts      Patient will benefit from skilled therapeutic intervention in order to improve the following deficits and impairments:  Abnormal gait, Pain, Decreased strength, Decreased range of motion, Decreased activity tolerance, Decreased safety awareness, Difficulty walking, Impaired flexibility  Visit Diagnosis: Pain in right hip  Muscle weakness (generalized)  Gait difficulty     Problem List There are no active problems to display for this patient.  Cammie McgeeMichael C Shawnita Krizek, PT, DPT # [id number] 10/06/2016, 12:59 PM  [facility] [address] [place], [place], [address] Phone: [phone]   Fax:  [phone]  Name: Donna NorthKathy A Liu MRN: [id number] Date of Birth: [birth year]

## 2016-10-11 ENCOUNTER — Encounter: Payer: 59 | Admitting: Physical Therapy

## 2016-11-02 NOTE — Addendum Note (Signed)
Addended by: Cammie McgeeSHERK, Allayah Raineri C on: 11/02/2016 02:28 PM   Modules accepted: Orders

## 2018-02-19 DIAGNOSIS — I891 Lymphangitis: Secondary | ICD-10-CM

## 2018-02-19 HISTORY — DX: Lymphangitis: I89.1

## 2019-01-18 ENCOUNTER — Ambulatory Visit
Admission: EM | Admit: 2019-01-18 | Discharge: 2019-01-18 | Disposition: A | Discharge status: Home / Self Care | Payer: Managed Care, Other (non HMO) | Attending: Family Medicine | Admitting: Family Medicine

## 2019-01-18 DIAGNOSIS — N3000 Acute cystitis without hematuria: Secondary | ICD-10-CM | POA: Diagnosis not present

## 2019-01-18 DIAGNOSIS — Z87440 Personal history of urinary (tract) infections: Secondary | ICD-10-CM | POA: Diagnosis not present

## 2019-01-18 LAB — URINALYSIS, COMPLETE (UACMP) WITH MICROSCOPIC
Bilirubin Urine: NEGATIVE
Glucose, UA: NEGATIVE mg/dL
Ketones, ur: NEGATIVE mg/dL
Nitrite: NEGATIVE
Protein, ur: NEGATIVE mg/dL
Specific Gravity, Urine: 1.025 (ref 1.005–1.030)
pH: 5.5 (ref 5.0–8.0)

## 2019-01-18 MED ORDER — NITROFURANTOIN MONOHYD MACRO 100 MG PO CAPS: 100.0000 mg | ORAL_CAPSULE | Freq: Two times a day (BID) | ORAL | 0 refills | Status: DC

## 2019-01-18 NOTE — ED Triage Notes (Signed)
Pt here for UTI. Dysuria, frequency and pelvic pressure since this morning. No otc meds tried.

## 2019-01-18 NOTE — ED Provider Notes (Signed)
[facility]    CSN: [id number] Arrival date & time: 01/18/19  1652  History   Chief Complaint Chief Complaint  Patient presents with  . Urinary Tract Infection   HPI  53 year old female presents with concern for UTI.  Patient reports her symptoms started this morning.  She reports dysuria, frequency, pelvic pain/pressure.  No medications or interventions tried.  Has a history of UTI.  No fever.  No flank pain.  No known exacerbating or relieving factors.  No other associated symptoms.  No other complaints.  PMH, Surgical Hx, Family Hx, Social History reviewed and updated as below.  Hyperlipidemia    Restless legs syndrome    Obesity    Depression    Adrenal incidentaloma (CMS-HCC)    Hiatal hernia 07/04/2013 06/2013  Arthritis    Diabetes Mellitus  resolved since surgery  Sleep apnea  resolved since surgery  Hypertension  resolved since surgery   Past Surgical History:  Procedure Laterality Date  . ABDOMINAL HYSTERECTOMY    . APPENDECTOMY    . CHOLECYSTECTOMY    . FOOT SURGERY Right   . GASTRIC BYPASS    . TONSILLECTOMY     OB History   No obstetric history on file.    Home Medications    Prior to Admission medications   Medication Sig Start Date End Date Taking? Authorizing Provider  nitrofurantoin, macrocrystal-monohydrate, (MACROBID) 100 MG capsule Take 1 capsule (100 mg total) by mouth 2 (two) times daily. 01/18/19   Tommie Sams, DO  omeprazole (PRILOSEC) 40 MG capsule Take 40 mg by mouth daily. 09/04/14   [provider]  ondansetron (ZOFRAN ODT) 8 MG disintegrating tablet Take 1 tablet (8 mg total) by mouth every 8 (eight) hours as needed for nausea or vomiting. 11/24/15   Hassan Rowan, MD    Family History Family History  Problem Relation Age of Onset  . Hypertension Mother   . Hyperlipidemia Mother   . Atrial fibrillation Mother   . Healthy Father     Social History Social History   Tobacco Use  . Smoking status:  Current Every Day Smoker    Packs/day: 0.50    Types: Cigarettes  . Smokeless tobacco: Never Used  Substance Use Topics  . Alcohol use: No  . Drug use: Not on file    Allergies   Penicillins; Tapentadol; and Sulfur   Review of Systems Review of Systems  Constitutional: Negative.   Genitourinary: Positive for dysuria, frequency and pelvic pain.   Physical Exam Triage Vital Signs ED Triage Vitals  Enc Vitals Group     BP 01/18/19 1701 (!) 153/85     Pulse Rate 01/18/19 1701 87     Resp 01/18/19 1701 18     Temp 01/18/19 1701 98.4 F (36.9 C)     Temp Source 01/18/19 1701 Oral     SpO2 01/18/19 1701 99 %     Weight 01/18/19 1702 190 lb (86.2 kg)     Height 01/18/19 1702 5\' 9"  (1.753 m)     Head Circumference --      Peak Flow --      Pain Score 01/18/19 1702 5     Pain Loc --      Pain Edu? --      Excl. in GC? --    Updated Vital Signs BP (!) 153/85 (BP Location: Left Arm)   Pulse 87   Temp 98.4 F (36.9 C) (Oral)   Resp 18  Ht 5\' 9"  (1.753 m)   Wt 86.2 kg   SpO2 99%   BMI 28.06 kg/m   Visual Acuity Right Eye Distance:   Left Eye Distance:   Bilateral Distance:    Right Eye Near:   Left Eye Near:    Bilateral Near:     Physical Exam Vitals signs and nursing note reviewed.  Constitutional:      General: She is not in acute distress.    Appearance: Normal appearance.  HENT:     Head: Normocephalic and atraumatic.  Eyes:     General:        Right eye: No discharge.        Left eye: No discharge.     Conjunctiva/sclera: Conjunctivae normal.  Cardiovascular:     Rate and Rhythm: Normal rate and regular rhythm.  Pulmonary:     Effort: Pulmonary effort is normal.     Breath sounds: Normal breath sounds.  Abdominal:     General: There is no distension.     Palpations: Abdomen is soft.     Tenderness: There is no abdominal tenderness.  Neurological:     Mental Status: She is alert.  Psychiatric:        Mood and Affect: Mood normal.         Behavior: Behavior normal.    UC Treatments / Results  Labs (all labs ordered are listed, but only abnormal results are displayed) Labs Reviewed  URINALYSIS, COMPLETE (UACMP) WITH MICROSCOPIC - Abnormal; Notable for the following components:      Result Value   Hgb urine dipstick SMALL (*)    Leukocytes,Ua TRACE (*)    Bacteria, UA FEW (*)    All other components within normal limits  URINE CULTURE    EKG None  Radiology No results found.  Procedures Procedures (including critical care time)  Medications Ordered in UC Medications - No data to display  Initial Impression / Assessment and Plan / UC Course  I have reviewed the triage vital signs and the nursing notes.  Pertinent labs & imaging results that were available during my care of the patient were reviewed by me and considered in my medical decision making (see chart for details).    53 year old female presents with UTI.  Treating with Macrobid.  Sending culture.  Final Clinical Impressions(s) / UC Diagnoses   Final diagnoses:  Acute cystitis without hematuria   Discharge Instructions   None    ED Prescriptions    Medication Sig Dispense Auth. Provider   nitrofurantoin, macrocrystal-monohydrate, (MACROBID) 100 MG capsule Take 1 capsule (100 mg total) by mouth 2 (two) times daily. 14 capsule Tommie Sams, DO     Controlled Substance Prescriptions [place] Controlled Substance Registry consulted? Not Applicable   Tommie Sams, DO 01/18/19 1734

## 2019-01-21 ENCOUNTER — Telehealth (HOSPITAL_COMMUNITY): Payer: Self-pay | Admitting: Emergency Medicine

## 2019-01-21 LAB — URINE CULTURE: Culture: >=100000 — AB

## 2019-01-21 NOTE — Telephone Encounter (Signed)
Urine culture was positive for e coli and was given  macrobid at urgent care visit. Attempted to reach patient. No answer at this time.   

## 2019-05-21 ENCOUNTER — Ambulatory Visit: Payer: Managed Care, Other (non HMO) | Admitting: Physical Therapy

## 2019-05-23 ENCOUNTER — Other Ambulatory Visit: Payer: Self-pay

## 2019-05-23 ENCOUNTER — Ambulatory Visit: Payer: Managed Care, Other (non HMO) | Attending: Orthopedic Surgery | Admitting: Physical Therapy

## 2019-05-23 ENCOUNTER — Ambulatory Visit: Payer: Managed Care, Other (non HMO) | Admitting: Physical Therapy

## 2019-05-23 DIAGNOSIS — M6281 Muscle weakness (generalized): Secondary | ICD-10-CM

## 2019-05-23 DIAGNOSIS — M25551 Pain in right hip: Secondary | ICD-10-CM | POA: Diagnosis not present

## 2019-05-23 DIAGNOSIS — R269 Unspecified abnormalities of gait and mobility: Secondary | ICD-10-CM

## 2019-05-25 NOTE — Therapy (Addendum)
[place] [facility] [address] [place], [place], [address] Phone: [phone]   Fax:  [phone]  Physical Therapy Evaluation  Patient Details  Name: Donna Liu MRN: [id number] Date of Birth: Dec 21, 1965 Referring Provider (PT): Dr. Allena KatzPatel   Encounter Date: 05/23/2019  PT End of Session - 06/24/19 2013    Visit Number  1    Number of Visits  12    Date for PT Re-Evaluation  07/04/19    PT Start Time  1254    PT Stop Time  1352    PT Time Calculation (min)  58 min    Activity Tolerance  Patient tolerated treatment well;Patient limited by pain    Behavior During Therapy  [facility] tasks assessed/performed       Past Medical History:  Diagnosis Date  . Acid reflux   . Hypertension     Past Surgical History:  Procedure Laterality Date  . ABDOMINAL HYSTERECTOMY    . APPENDECTOMY    . CHOLECYSTECTOMY    . FOOT SURGERY Right   . GASTRIC BYPASS    . TONSILLECTOMY      There were no vitals filed for this visit.   Subjective Assessment - 06/24/19 2006    Subjective  Pt. referred to PT secondary to chronic R hip pain.  Pt. s/p R hip surgery 08/2018 with poor outcomes per pt.  See MD notes/ surgical history.  Pt. known well to PT for previous issues.    Limitations  House hold activities;Walking;Standing    Patient Stated Goals  Decrease hip/LE pain with standing and walking tasks.      Currently in Pain?  Yes    Pain Score  3     Pain Location  Hip    Pain Orientation  Right    Pain Descriptors / Indicators  Aching    Pain Type  Chronic pain      (+) R greater trochanter/ ITB tenderness Good hip symmetry ASIS/ PSIS (+) R glut. Med./ piriformis muscle tightness as compared to L (pain on R) R antalgic gait Pain with stairs Currently not participating with HEP Core muscle/ R hip flexion and abduction weakness     Objective measurements completed on examination: See above findings.      See HEP (piriformis/ ITB/  trunk stretches in supine/ core stability ex. Page 1-2).     PT Education - 06/24/19 2012    Education provided  Yes    Education Details  See HEP    Person(s) Educated  Patient    Methods  Explanation;Demonstration;Handout    Comprehension  Verbalized understanding;Returned demonstration          PT Long Term Goals - 06/24/19 2021      PT LONG TERM GOAL #1   Title  Pt. I with HEP to increase R hip AROM (all planes) to WNL as compared to L to improve pain-free mobility.    Baseline  R hip pain with end-range piriformis/ hip abd. stretch.    Time  6    Period  Weeks    Status  New    Target Date  07/04/19      PT LONG TERM GOAL #2   Title  Pt. will report no R hip/ proximal ITB tenderness with palpation to improve pain-free mobility.     Baseline  (+) tenderness over greater trochanter to mid. ITB    Time  6    Period  Weeks  Status  New    Target Date  07/04/19      PT LONG TERM GOAL #3   Title  Pt. able to ambulate with normalized gait pattern without assistive device and no c/o R hip pain to improve mobility.    Baseline  R hip antalgic gait pattern/ occasionally benefits from use of SPC    Time  6    Period  Weeks    Status  New    Target Date  07/04/19      PT LONG TERM GOAL #4   Title  Pt. will increase FOTO to 72 to improve pain-free functional mobility.    Baseline  Initial FOTO: 63    Time  6    Period  Weeks    Status  New    Target Date  07/04/19             Plan - 06/24/19 2014    Clinical Impression Statement  Pt. is a 53 y/o female with chronic c/o R hip pain.  Pt. s/p R hip surgery 08/2018 with continued c/o R hip/proximal ITB tenderness and pain (3/10).  No swelling or brusing noted. Pt. presents with good ASIS/hip symmetry in supine and standing posture.  R antalgic gait noted with decrease stance time on R and pt. reports requiring occasional use of SPC.  R hip pain with modified pirifomis/gluteal/ ITB stretches.  (-) Ober but proximal to  mid. ITB tendernss on R, not L side.  B LE muscle strength 5/5 MMT except hip flexion/ abduction 4+/5 MMT with increase pain during reported. FOTO: initial 63/ goal 72.  Pt. will benefit from short-term skilled PT services to develop hip/core exercise program to decrease R hip pain with daily tasks.    Stability/Clinical Decision Making  Evolving/Moderate complexity    Clinical Decision Making  Moderate    Rehab Potential  Good    PT Frequency  2x / week    PT Duration  6 weeks    PT Treatment/Interventions  Cryotherapy;Electrical Stimulation;Moist Heat;Therapeutic exercise;Therapeutic activities;Functional mobility training;Stair training;Gait training;Balance training;Neuromuscular re-education;Patient/family education;Manual techniques;Passive range of motion;Dry needling    PT Next Visit Plan  R hip stretches/ core and hip stability ex.    PT Home Exercise Plan  See handouts       Patient will benefit from skilled therapeutic intervention in order to improve the following deficits and impairments:  Abnormal gait, Pain, Decreased strength, Decreased range of motion, Decreased activity tolerance, Decreased safety awareness, Difficulty walking, Impaired flexibility  Visit Diagnosis: 1. Pain in right hip   2. Muscle weakness   3. Gait difficulty        Problem List There are no active problems to display for this patient.  Pura Spice, PT, DPT # [id number] 06/24/2019, 8:28 PM  [place] [facility] [address] [place], [place], [address] Phone: [phone]   Fax:  [phone]  Name: Donna Liu MRN: [id number] Date of Birth: 06-08-1966

## 2019-05-28 ENCOUNTER — Ambulatory Visit: Payer: Managed Care, Other (non HMO) | Admitting: Physical Therapy

## 2019-05-28 ENCOUNTER — Other Ambulatory Visit: Payer: Self-pay

## 2019-05-28 DIAGNOSIS — R269 Unspecified abnormalities of gait and mobility: Secondary | ICD-10-CM

## 2019-05-28 DIAGNOSIS — M25551 Pain in right hip: Secondary | ICD-10-CM

## 2019-05-28 DIAGNOSIS — M6281 Muscle weakness (generalized): Secondary | ICD-10-CM

## 2019-05-30 ENCOUNTER — Encounter: Payer: Managed Care, Other (non HMO) | Admitting: Physical Therapy

## 2019-05-31 NOTE — Therapy (Addendum)
[place] [facility] [address] [place], [place], [address] Phone: [phone]   Fax:  [phone]  Physical Therapy Treatment  Patient Details  Name: Donna Liu MRN: [id number] Date of Birth: 11/06/1965 Referring Provider (PT): Dr. Posey Pronto   Encounter Date: 05/28/2019  PT End of Session - 06/24/19 2034    Visit Number  2    Number of Visits  12    Date for PT Re-Evaluation  07/04/19    PT Start Time  1344    PT Stop Time  1439    PT Time Calculation (min)  55 min    Activity Tolerance  Patient tolerated treatment well;Patient limited by pain    Behavior During Therapy  [place] for tasks assessed/performed       Past Medical History:  Diagnosis Date  . Acid reflux   . Hypertension     Past Surgical History:  Procedure Laterality Date  . ABDOMINAL HYSTERECTOMY    . APPENDECTOMY    . CHOLECYSTECTOMY    . FOOT SURGERY Right   . GASTRIC BYPASS    . TONSILLECTOMY      There were no vitals filed for this visit.  Subjective Assessment - 06/24/19 2032    Subjective  Increase R hip soreness after last tx. which resolved in a few days.  Pt. reports completion of HEP but not daily.    Limitations  House hold activities;Walking;Standing    Patient Stated Goals  Decrease hip/LE pain with standing and walking tasks.      Currently in Pain?  Yes    Pain Score  5     Pain Location  Hip    Pain Orientation  Right    Pain Descriptors / Indicators  Aching;Constant         Therex:  Scifit L6 10 min. B LE (warm-up) Reviewed HEP Core stability: TrA muscle activation/ heel slides/ hip abduction/ marching/ SLR/ bridging/ bicycles 10x each  Manual tx.:  Supine piriformis/ hamstring/ quadriceps/ hip flexor/ trunk rotn. Stretches STM to R hip/ ITB musculature Discussed ice vs. heat         PT Long Term Goals - 06/24/19 2021      PT LONG TERM GOAL #1   Title  Pt. I with HEP to increase R hip AROM (all planes) to WNL as  compared to L to improve pain-free mobility.    Baseline  R hip pain with end-range piriformis/ hip abd. stretch.    Time  6    Period  Weeks    Status  New    Target Date  07/04/19      PT LONG TERM GOAL #2   Title  Pt. will report no R hip/ proximal ITB tenderness with palpation to improve pain-free mobility.     Baseline  (+) tenderness over greater trochanter to mid. ITB    Time  6    Period  Weeks    Status  New    Target Date  07/04/19      PT LONG TERM GOAL #3   Title  Pt. able to ambulate with normalized gait pattern without assistive device and no c/o R hip pain to improve mobility.    Baseline  R hip antalgic gait pattern/ occasionally benefits from use of SPC    Time  6    Period  Weeks    Status  New    Target Date  07/04/19      PT  LONG TERM GOAL #4   Title  Pt. will increase FOTO to 72 to improve pain-free functional mobility.    Baseline  Initial FOTO: 63    Time  6    Period  Weeks    Status  New    Target Date  07/04/19            Plan - 06/24/19 2034    Clinical Impression Statement  Moderate R antalgic gait pattern noted with initial 10 steps after standing.  Pt. able to slight improve gait pattern with verbal cuing and mirror feedback.  (+) generalized R proximal hip/ ITB tenderness present.  PT reviewed HEP/ core strengthening progression.  No changes to HEP at this time.    Stability/Clinical Decision Making  Evolving/Moderate complexity    Clinical Decision Making  Moderate    Rehab Potential  Good    PT Frequency  2x / week    PT Duration  6 weeks    PT Treatment/Interventions  Cryotherapy;Electrical Stimulation;Moist Heat;Therapeutic exercise;Therapeutic activities;Functional mobility training;Stair training;Gait training;Balance training;Neuromuscular re-education;Patient/family education;Manual techniques;Passive range of motion;Dry needling    PT Next Visit Plan  R hip stretches/ core and hip stability ex.    PT Home Exercise Plan  See  handouts       Patient will benefit from skilled therapeutic intervention in order to improve the following deficits and impairments:  Abnormal gait, Pain, Decreased strength, Decreased range of motion, Decreased activity tolerance, Decreased safety awareness, Difficulty walking, Impaired flexibility  Visit Diagnosis: 1. Pain in right hip   2. Muscle weakness   3. Gait difficulty        Problem List There are no active problems to display for this patient.  Cammie McgeeMichael C Barbarann Kelly, PT, DPT # [phone] 06/24/2019, 8:37 PM   [facility] [address] [place], [place], [address] Phone: [phone]   Fax:  [phone]  Name: Patrick NorthKathy A Petitti MRN: [id number] Date of Birth: [birth year]

## 2019-06-04 ENCOUNTER — Ambulatory Visit: Payer: Managed Care, Other (non HMO) | Admitting: Physical Therapy

## 2019-06-04 ENCOUNTER — Other Ambulatory Visit: Payer: Self-pay

## 2019-06-04 DIAGNOSIS — M6281 Muscle weakness (generalized): Secondary | ICD-10-CM

## 2019-06-04 DIAGNOSIS — M25551 Pain in right hip: Secondary | ICD-10-CM

## 2019-06-04 DIAGNOSIS — R269 Unspecified abnormalities of gait and mobility: Secondary | ICD-10-CM

## 2019-06-06 ENCOUNTER — Ambulatory Visit: Payer: Managed Care, Other (non HMO) | Admitting: Physical Therapy

## 2019-06-08 NOTE — Therapy (Addendum)
[place] [facility] [address] [place], [place], [address] Phone: [phone]   Fax:  [phone]  Physical Therapy Treatment  Patient Details  Name: Donna Liu MRN: [id number] Date of Birth: 08/13/1966 Referring Provider (PT): Dr. Allena KatzPatel   Encounter Date: 06/04/2019  PT End of Session - 06/24/19 2046    Visit Number  3    Number of Visits  12    Date for PT Re-Evaluation  07/04/19    PT Start Time  1349    PT Stop Time  1446    PT Time Calculation (min)  57 min    Activity Tolerance  Patient tolerated treatment well;Patient limited by pain    Behavior During Therapy  [facility] for tasks assessed/performed       Past Medical History:  Diagnosis Date  . Acid reflux   . Hypertension     Past Surgical History:  Procedure Laterality Date  . ABDOMINAL HYSTERECTOMY    . APPENDECTOMY    . CHOLECYSTECTOMY    . FOOT SURGERY Right   . GASTRIC BYPASS    . TONSILLECTOMY      There were no vitals filed for this visit.  Subjective Assessment - 06/24/19 2043    Subjective  Pt. states she is doing alittle better.  Pt. reports less R hip soreness after last tx. session as compared to initial evaluation.    Limitations  House hold activities;Walking;Standing    Patient Stated Goals  Decrease hip/LE pain with standing and walking tasks.      Currently in Pain?  Yes    Pain Score  3     Pain Location  Hip    Pain Orientation  Right    Pain Descriptors / Indicators  Aching        There.ex.:  Scifit L7 10 min. B LE (warm-up/no charge) Supine core based ex. Program (added ball ex. With knee to chest/ bridging/ rotn.)- maintain TrA activation with tactile feedback  Manual tx.:  Supine B hip stretches (generalized) STM to R glut. Med./ ITB/ piriformis region during gentle stretches and position changes  Ice to R hip in L sidelying position after tx.     PT Long Term Goals - 06/24/19 2021      PT LONG TERM GOAL #1   Title  Pt.  I with HEP to increase R hip AROM (all planes) to WNL as compared to L to improve pain-free mobility.    Baseline  R hip pain with end-range piriformis/ hip abd. stretch.    Time  6    Period  Weeks    Status  New    Target Date  07/04/19      PT LONG TERM GOAL #2   Title  Pt. will report no R hip/ proximal ITB tenderness with palpation to improve pain-free mobility.     Baseline  (+) tenderness over greater trochanter to mid. ITB    Time  6    Period  Weeks    Status  New    Target Date  07/04/19      PT LONG TERM GOAL #3   Title  Pt. able to ambulate with normalized gait pattern without assistive device and no c/o R hip pain to improve mobility.    Baseline  R hip antalgic gait pattern/ occasionally benefits from use of SPC    Time  6    Period  Weeks    Status  New  Target Date  07/04/19      PT LONG TERM GOAL #4   Title  Pt. will increase FOTO to 72 to improve pain-free functional mobility.    Baseline  Initial FOTO: 63    Time  6    Period  Weeks    Status  New    Target Date  07/04/19            Plan - 06/24/19 2046    Clinical Impression Statement  Pt. doing well with hip/core strengthening ex. program in supine position.  Good technique with no increase c/o pain reported.  PT discussed the benefits of pts. personal home TENS unit for pain mgmt./ decrease inflammation in R hip with daily tasks.  Decrease R hip tenderness after use of ice/ STM during PT tx. session.    Stability/Clinical Decision Making  Evolving/Moderate complexity    Clinical Decision Making  Moderate    Rehab Potential  Good    PT Frequency  2x / week    PT Duration  6 weeks    PT Treatment/Interventions  Cryotherapy;Electrical Stimulation;Moist Heat;Therapeutic exercise;Therapeutic activities;Functional mobility training;Stair training;Gait training;Balance training;Neuromuscular re-education;Patient/family education;Manual techniques;Passive range of motion;Dry needling    PT Next Visit Plan   R hip stretches/ core and hip stability ex.    PT Home Exercise Plan  See handouts       Patient will benefit from skilled therapeutic intervention in order to improve the following deficits and impairments:  Abnormal gait, Pain, Decreased strength, Decreased range of motion, Decreased activity tolerance, Decreased safety awareness, Difficulty walking, Impaired flexibility  Visit Diagnosis: Pain in right hip  Muscle weakness  Gait difficulty     Problem List There are no active problems to display for this patient.  Pura Spice, PT, DPT # [id number] 06/24/2019, 8:48 PM  [place] [facility] [address] [place], [place], [address] Phone: [phone]   Fax:  [phone]  Name: Donna Liu MRN: [id number] Date of Birth: 08/13/1966

## 2019-06-11 ENCOUNTER — Encounter: Payer: Managed Care, Other (non HMO) | Admitting: Physical Therapy

## 2019-06-13 ENCOUNTER — Ambulatory Visit: Payer: Managed Care, Other (non HMO) | Admitting: Physical Therapy

## 2019-06-18 ENCOUNTER — Ambulatory Visit: Payer: Managed Care, Other (non HMO) | Attending: Orthopedic Surgery | Admitting: Physical Therapy

## 2019-06-18 ENCOUNTER — Other Ambulatory Visit: Payer: Self-pay

## 2019-06-18 DIAGNOSIS — R269 Unspecified abnormalities of gait and mobility: Secondary | ICD-10-CM | POA: Diagnosis present

## 2019-06-18 DIAGNOSIS — M6281 Muscle weakness (generalized): Secondary | ICD-10-CM | POA: Diagnosis present

## 2019-06-18 DIAGNOSIS — M25551 Pain in right hip: Secondary | ICD-10-CM | POA: Diagnosis not present

## 2019-06-20 ENCOUNTER — Ambulatory Visit: Payer: Managed Care, Other (non HMO) | Admitting: Physical Therapy

## 2019-06-21 NOTE — Therapy (Addendum)
[place] [facility] [address] [place], [place], [address] Phone: [phone]   Fax:  [phone]  Physical Therapy Treatment  Patient Details  Name: Donna Liu MRN: [id number] Date of Birth: [birth year] Referring Provider (PT): Dr. Posey Pronto   Encounter Date: 06/18/2019  PT End of Session - 06/24/19 2104    Visit Number  4    Number of Visits  12    Date for PT Re-Evaluation  07/04/19    PT Start Time  1344    PT Stop Time  1446    PT Time Calculation (min)  62 min    Activity Tolerance  Patient tolerated treatment well;Patient limited by pain    Behavior During Therapy  [facility] for tasks assessed/performed       Past Medical History:  Diagnosis Date  . Acid reflux   . Hypertension     Past Surgical History:  Procedure Laterality Date  . ABDOMINAL HYSTERECTOMY    . APPENDECTOMY    . CHOLECYSTECTOMY    . FOOT SURGERY Right   . GASTRIC BYPASS    . TONSILLECTOMY      There were no vitals filed for this visit.  Subjective Assessment - 06/24/19 2055    Subjective  Pt. states she missed last several tx. sessions because she was out of town/ husband's cataract surgery.    Limitations  House hold activities;Walking;Standing    Patient Stated Goals  Decrease hip/LE pain with standing and walking tasks.      Currently in Pain?  Yes    Pain Score  3     Pain Location  Hip    Pain Orientation  Right    Pain Descriptors / Indicators  Aching        There.ex.:   Scifit L6 10 min. B LE (warm-up) Core stability: TrA muscle activation/ heel slides/ hip abduction with GTB/ marching with GTB/ SLR/ bridging/ bicycles 10x each Nautilus: 70# forward/ backwards/ lateral walk outs 5x each.    Manual tx.:  Supine piriformis/ hamstring/ quadriceps/ hip flexor/ trunk rotn. Stretches (15 min.) Ice to R hip after tx. Session in L sidelying position.     PT Long Term Goals - 06/24/19 2021      PT LONG TERM GOAL #1   Title  Pt. I  with HEP to increase R hip AROM (all planes) to WNL as compared to L to improve pain-free mobility.    Baseline  R hip pain with end-range piriformis/ hip abd. stretch.    Time  6    Period  Weeks    Status  New    Target Date  07/04/19      PT LONG TERM GOAL #2   Title  Pt. will report no R hip/ proximal ITB tenderness with palpation to improve pain-free mobility.     Baseline  (+) tenderness over greater trochanter to mid. ITB    Time  6    Period  Weeks    Status  New    Target Date  07/04/19      PT LONG TERM GOAL #3   Title  Pt. able to ambulate with normalized gait pattern without assistive device and no c/o R hip pain to improve mobility.    Baseline  R hip antalgic gait pattern/ occasionally benefits from use of SPC    Time  6    Period  Weeks    Status  New    Target Date  07/04/19      PT LONG TERM GOAL #4   Title  Pt. will increase FOTO to 72 to improve pain-free functional mobility.    Baseline  Initial FOTO: 63    Time  6    Period  Weeks    Status  New    Target Date  07/04/19            Plan - 06/24/19 2105    Clinical Impression Statement  PT focus on R hip/ core strengthening ex. today.  Standing hip abduction/ extension/ flexion with resistance and no increase c/o pain.  Pt. continues to show progress towards goals/ R hip mobility.  No changes to HEP at this time.    Stability/Clinical Decision Making  Evolving/Moderate complexity    Clinical Decision Making  Moderate    Rehab Potential  Good    PT Frequency  2x / week    PT Duration  6 weeks    PT Treatment/Interventions  Cryotherapy;Electrical Stimulation;Moist Heat;Therapeutic exercise;Therapeutic activities;Functional mobility training;Stair training;Gait training;Balance training;Neuromuscular re-education;Patient/family education;Manual techniques;Passive range of motion;Dry needling    PT Next Visit Plan  R hip stretches/ core and hip stability ex.  ISSUE NEW HEP    PT Home Exercise Plan  See  handouts       Patient will benefit from skilled therapeutic intervention in order to improve the following deficits and impairments:  Abnormal gait, Pain, Decreased strength, Decreased range of motion, Decreased activity tolerance, Decreased safety awareness, Difficulty walking, Impaired flexibility  Visit Diagnosis: Pain in right hip  Muscle weakness  Gait difficulty     Problem List There are no active problems to display for this patient.  Cammie McgeeMichael C Sidney Kann, PT, DPT # [phone] 06/24/2019, 9:08 PM  [place] [facility] [address] [place], [place], [address] Phone: [phone]   Fax:  [phone]  Name: Donna Liu MRN: [id number] Date of Birth: [birth year]

## 2019-06-24 NOTE — Addendum Note (Signed)
Addended by: Pura Spice on: 06/24/2019 08:32 PM   Modules accepted: Orders

## 2019-06-25 ENCOUNTER — Ambulatory Visit: Payer: Managed Care, Other (non HMO) | Admitting: Physical Therapy

## 2019-06-27 ENCOUNTER — Ambulatory Visit: Payer: Managed Care, Other (non HMO) | Admitting: Physical Therapy

## 2019-06-27 ENCOUNTER — Other Ambulatory Visit: Payer: Self-pay

## 2019-06-27 ENCOUNTER — Encounter: Payer: Self-pay | Admitting: Physical Therapy

## 2019-06-27 DIAGNOSIS — M25551 Pain in right hip: Secondary | ICD-10-CM

## 2019-06-27 DIAGNOSIS — M6281 Muscle weakness (generalized): Secondary | ICD-10-CM

## 2019-06-27 DIAGNOSIS — R269 Unspecified abnormalities of gait and mobility: Secondary | ICD-10-CM

## 2019-06-29 NOTE — Therapy (Addendum)
[place] [facility] [address] [place], [place], [address] Phone: [phone]   Fax:  [phone]  Physical Therapy Treatment  Patient Details  Name: Donna Liu MRN: [id number] Date of Birth: Dec 10, 1965 Referring Provider (PT): Dr. Posey Pronto   Encounter Date: 06/27/2019   Treatment: 5 of 12.  Discharge 1303 to 1351   Past Medical History:  Diagnosis Date  . Acid reflux   . Hypertension     Past Surgical History:  Procedure Laterality Date  . ABDOMINAL HYSTERECTOMY    . APPENDECTOMY    . CHOLECYSTECTOMY    . FOOT SURGERY Right   . GASTRIC BYPASS    . TONSILLECTOMY      There were no vitals filed for this visit.    Pt. states she is doing better. Pt. reports doing HEP at least 1x since last tx. session.        There.ex.:   Scifit L6 10 min. B LE (warm-up) Core stability: TrA muscle activation/ heel slides/ hip abduction with GTB/ marching with GTB/ SLR/ bridging/ bicycles 10x each Nautilus: 70# forward/ backwards/ lateral walk outs 5x each.   Reviewed HEP (see handouts)  Manual tx.:  Supine piriformis/ hamstring/ quadriceps/ hip flexor/ trunk rotn. Stretches (20 min.)   Pt. Instructed to contact PT if any questions or exacerbation of R hip symptoms.      PT Long Term Goals - 06/30/19 1508      PT LONG TERM GOAL #1   Title  Pt. I with HEP to increase R hip AROM (all planes) to WNL as compared to L to improve pain-free mobility.    Baseline  I with HEP    Time  6    Period  Weeks    Status  Achieved    Target Date  06/27/19      PT LONG TERM GOAL #2   Title  Pt. will report no R hip/ proximal ITB tenderness with palpation to improve pain-free mobility.     Baseline  Tenderness present but improved.    Time  6    Period  Weeks    Status  Partially Met    Target Date  06/27/19      PT LONG TERM GOAL #3   Title  Pt. able to ambulate with normalized gait pattern without assistive device and no  c/o R hip pain to improve mobility.    Baseline  Marked improvement in gait pattern    Time  6    Period  Weeks    Status  Achieved    Target Date  06/27/19      PT LONG TERM GOAL #4   Title  Pt. will increase FOTO to 72 to improve pain-free functional mobility.    Baseline  Initial FOTO: 63    Time  6    Period  Weeks    Status  Unable to assess    Target Date  06/27/19            Plan - 06/30/19 1500    Clinical Impression Statement  Pt. has progressed well towards PT goals.  Pt. reports no R hip discomfort today and is ambulating around PT clinic with normalized gait pattern.  Pt. understands importance of consistent stretching/ strengthening ex. program.  Discharge from PT at this time.  Pt. instructed to contact PT if any issues or questions in upcoming weeks.    Stability/Clinical Decision Making  Evolving/Moderate complexity    Clinical  Decision Making  Moderate    Rehab Potential  Good    PT Frequency  2x / week    PT Duration  6 weeks    PT Treatment/Interventions  Cryotherapy;Electrical Stimulation;Moist Heat;Therapeutic exercise;Therapeutic activities;Functional mobility training;Stair training;Gait training;Balance training;Neuromuscular re-education;Patient/family education;Manual techniques;Passive range of motion;Dry needling    PT Next Visit Plan  Discharge    PT Home Exercise Plan  See handouts       Patient will benefit from skilled therapeutic intervention in order to improve the following deficits and impairments:  Abnormal gait, Pain, Decreased strength, Decreased range of motion, Decreased activity tolerance, Decreased safety awareness, Difficulty walking, Impaired flexibility  Visit Diagnosis: Pain in right hip  Muscle weakness  Gait difficulty     Problem List There are no active problems to display for this patient.  Pura Spice, PT, DPT # [id number] 06/30/2019, 3:11 PM  [place] [facility] [address] [place], [place], [address] Phone: [phone]   Fax:  [phone]  Name: Donna Liu MRN: [id number] Date of Birth: July 18, 1966

## 2020-04-08 ENCOUNTER — Other Ambulatory Visit: Payer: Self-pay

## 2020-04-08 ENCOUNTER — Ambulatory Visit: Payer: Managed Care, Other (non HMO) | Attending: Physician Assistant | Admitting: Physical Therapy

## 2020-04-08 DIAGNOSIS — M5441 Lumbago with sciatica, right side: Secondary | ICD-10-CM | POA: Insufficient documentation

## 2020-04-08 DIAGNOSIS — M5442 Lumbago with sciatica, left side: Secondary | ICD-10-CM | POA: Insufficient documentation

## 2020-04-08 DIAGNOSIS — G8929 Other chronic pain: Secondary | ICD-10-CM | POA: Diagnosis present

## 2020-04-08 DIAGNOSIS — M6281 Muscle weakness (generalized): Secondary | ICD-10-CM | POA: Diagnosis present

## 2020-04-14 ENCOUNTER — Ambulatory Visit: Payer: Managed Care, Other (non HMO) | Admitting: Physical Therapy

## 2020-04-15 ENCOUNTER — Encounter: Payer: Self-pay | Admitting: Physical Therapy

## 2020-04-15 NOTE — Therapy (Addendum)
[facility] [address] [place], [place], [address] Phone: [phone]   Fax:  [phone]  Physical Therapy Evaluation  Patient Details  Name: Donna Liu MRN: [id number] Date of Birth: 04/09/1966 Referring Provider (PT): Jennet Maduro, PA-C   Encounter Date: 04/08/2020    PT End of Session - 04/15/20 1539    Visit Number 1    Number of Visits 9    Date for PT Re-Evaluation 05/06/20    Authorization - Visit Number 1    Authorization - Number of Visits 10    PT Start Time 1039    PT Stop Time 1129    PT Time Calculation (min) 50 min    Activity Tolerance Patient tolerated treatment well;Patient limited by pain    Behavior During Therapy [facility] for tasks assessed/performed             Past Medical History:  Diagnosis Date  . Acid reflux   . Hypertension     Past Surgical History:  Procedure Laterality Date  . ABDOMINAL HYSTERECTOMY    . APPENDECTOMY    . CHOLECYSTECTOMY    . FOOT SURGERY Right   . GASTRIC BYPASS    . TONSILLECTOMY      There were no vitals filed for this visit.   Pt. reports chronic h/o low back pain with B LE radicular symptoms to arch of foot. Pt. states colder weather increases symptoms and has a h/o muscle cramps in low back after 30 minutes at pool. Pt. states Lyrica helps and current pain score is 5/10. Pt. reports 2/10 at best with meds and >9/10 at worst.        (+) R greater trochanter/ ITB tenderness Good hip symmetry ASIS/ PSIS (+) R glut. Med./ piriformis muscle tightness as compared to L (pain on R) Normalized gait Pain with return to upright posture during lumbar flexion/ extension Currently not participating with HEP Core muscle/ R hip flexion and abduction weakness        Objective measurements completed on examination: See above findings.     See HEP (core stability ex. Page 1-2).     PT Long Term Goals - 04/10/20 1001      PT LONG TERM GOAL #1    Title Pt. I with HEP to increase lumbar flexion/ core stability to WNL to improve pain-free mobility.    Baseline Pt. currently not exercising.  Issued core program. (page #1-2)    Time 4    Period Weeks    Status New    Target Date 05/06/20      PT LONG TERM GOAL #2   Title Pt. will report no lumbar paraspinal muscle tenderness/ cramps with palpation to improve pain-free mobility.    Baseline Pt. reports muscle cramping in low back with increase activity/ swimming in pool    Time 4    Period Weeks    Status New    Target Date 05/06/20      PT LONG TERM GOAL #3   Title Pt. able to complete 30 minutes of household chores with no increase c/o back pain or radicular symptoms.    Baseline Increase c/o pain with activity    Time 4    Period Weeks    Status New    Target Date 05/06/20           Pt. is a 54 y/o female with chronic c/o low back/R hip pain/radicular symptoms. Pt. s/p R hip surgery 08/2018  with continued c/o R hip/proximal ITB tenderness and pain (5/10). Pt. presents with good ASIS/hip symmetry in supine and standing posture. Pt. entered PT with a normalized gait pattern with increase R hip pain. Lumbar flexion WFL (increase pain with return to standing posture). Lumbar rotn./ extension WNL (no increase symptoms). B LE muscle strength 5/5 MMT except hip flexion/ abduction 4+/5 MMT with increase pain during reported. Pt. will benefit from short-term skilled PT services to develop hip/core exercise program to decrease pain with daily tasks.        Patient will benefit from skilled therapeutic intervention in order to improve the following deficits and impairments:  Abnormal gait, Pain, Decreased strength, Decreased range of motion, Decreased activity tolerance, Decreased safety awareness, Difficulty walking, Impaired flexibility  Visit Diagnosis: Chronic bilateral low back pain with bilateral sciatica  Muscle weakness (generalized)     Problem List There are no  problems to display for this patient.  Cammie Mcgee, PT, DPT # [phone] 05/19/2020, 10:11 AM  [place] [facility] [address] [place], [place], [address] Phone: [phone]   Fax:  [phone]  Name: Donna Liu MRN: [id number] Date of Birth: May 26, 1966

## 2020-04-16 ENCOUNTER — Other Ambulatory Visit: Payer: Self-pay

## 2020-04-16 ENCOUNTER — Ambulatory Visit: Payer: Managed Care, Other (non HMO) | Attending: Physician Assistant | Admitting: Physical Therapy

## 2020-04-16 DIAGNOSIS — M25551 Pain in right hip: Secondary | ICD-10-CM | POA: Insufficient documentation

## 2020-04-16 DIAGNOSIS — M6281 Muscle weakness (generalized): Secondary | ICD-10-CM | POA: Insufficient documentation

## 2020-04-16 DIAGNOSIS — M5442 Lumbago with sciatica, left side: Secondary | ICD-10-CM | POA: Insufficient documentation

## 2020-04-16 DIAGNOSIS — G8929 Other chronic pain: Secondary | ICD-10-CM | POA: Diagnosis present

## 2020-04-16 DIAGNOSIS — R269 Unspecified abnormalities of gait and mobility: Secondary | ICD-10-CM | POA: Diagnosis present

## 2020-04-16 DIAGNOSIS — M5441 Lumbago with sciatica, right side: Secondary | ICD-10-CM | POA: Insufficient documentation

## 2020-04-17 NOTE — Therapy (Addendum)
[place] [facility] [address] [place], [place], [address] Phone: [phone]   Fax:  [phone]  Physical Therapy Treatment  Patient Details  Name: Donna Liu MRN: [id number] Date of Birth: Jul 08, 1966 Referring Provider (PT): Tessa Lerner, PA-C   Encounter Date: 04/16/2020   PT End of Session - 04/17/20 1049    Visit Number 2    Number of Visits 9    Date for PT Re-Evaluation 05/06/20    Authorization - Visit Number 2    Authorization - Number of Visits 10    PT Start Time [phone]    PT Stop Time 1034    PT Time Calculation (min) 51 min    Activity Tolerance Patient tolerated treatment well;Patient limited by pain    Behavior During Therapy [facility] for tasks assessed/performed           Past Medical History:  Diagnosis Date  . Acid reflux   . Hypertension     Past Surgical History:  Procedure Laterality Date  . ABDOMINAL HYSTERECTOMY    . APPENDECTOMY    . CHOLECYSTECTOMY    . FOOT SURGERY Right   . GASTRIC BYPASS    . TONSILLECTOMY      There were no vitals filed for this visit.     Pt. reports soreness in back after initial evaluation. No increase c/o B LE radicular symptoms. Pt. has been in pool with grandson.         There.ex.:  Reviewed HEP/ supine TrA reassessment Supine hip abduction (unilateral/ bilateral)- added RTB  Manual tx.:  Supine LE/lumbar generalized stretching Gentle LAD on L/R in supine position with osciallation STM to lumbar paraspinals in seated/ sidelying position.       PT Long Term Goals - 06/30/19 1508      PT LONG TERM GOAL #1   Title Pt. I with HEP to increase R hip AROM (all planes) to WNL as compared to L to improve pain-free mobility.    Baseline I with HEP    Time 6    Period Weeks    Status Achieved    Target Date 06/27/19      PT LONG TERM GOAL #2   Title Pt. will report no R hip/ proximal ITB tenderness with palpation to improve pain-free mobility.      Baseline Tenderness present but improved.    Time 6    Period Weeks    Status Partially Met    Target Date 06/27/19      PT LONG TERM GOAL #3   Title Pt. able to ambulate with normalized gait pattern without assistive device and no c/o R hip pain to improve mobility.    Baseline Marked improvement in gait pattern    Time 6    Period Weeks    Status Achieved    Target Date 06/27/19      PT LONG TERM GOAL #4   Title Pt. will increase FOTO to 72 to improve pain-free functional mobility.    Baseline Initial FOTO: 63    Time 6    Period Weeks    Status Unable to assess    Target Date 06/27/19             Generalized low back tenderness with light palpation and along R hip/ glut. med.  Pt. demonstrates good technique/ understanding of core and LE strengthening ex. program.  PT encourages continued use of pool and daily stretches/ HEP.  Pt. is not  a fan of massage but may benefit from use of Hypervolt (low setting) with manual tx.      Patient will benefit from skilled therapeutic intervention in order to improve the following deficits and impairments:     Visit Diagnosis: Chronic bilateral low back pain with bilateral sciatica  Muscle weakness (generalized)  Pain in right hip     Problem List There are no problems to display for this patient.  Pura Spice, PT, DPT # [phone] 04/17/2020, 10:57 AM  [place] [facility] [address] [place], [place], [address] Phone: [phone]   Fax:  [phone]  Name: Donna Liu MRN: [id number] Date of Birth: 1966-02-23

## 2020-04-21 ENCOUNTER — Other Ambulatory Visit: Payer: Self-pay

## 2020-04-21 ENCOUNTER — Ambulatory Visit: Payer: Managed Care, Other (non HMO) | Admitting: Physical Therapy

## 2020-04-21 DIAGNOSIS — M6281 Muscle weakness (generalized): Secondary | ICD-10-CM

## 2020-04-21 DIAGNOSIS — G8929 Other chronic pain: Secondary | ICD-10-CM

## 2020-04-21 DIAGNOSIS — M5442 Lumbago with sciatica, left side: Secondary | ICD-10-CM | POA: Diagnosis not present

## 2020-04-21 DIAGNOSIS — M25551 Pain in right hip: Secondary | ICD-10-CM

## 2020-04-21 DIAGNOSIS — R269 Unspecified abnormalities of gait and mobility: Secondary | ICD-10-CM

## 2020-04-23 ENCOUNTER — Ambulatory Visit: Payer: Managed Care, Other (non HMO) | Admitting: Physical Therapy

## 2020-04-23 ENCOUNTER — Other Ambulatory Visit: Payer: Self-pay

## 2020-04-23 DIAGNOSIS — M25551 Pain in right hip: Secondary | ICD-10-CM

## 2020-04-23 DIAGNOSIS — M5441 Lumbago with sciatica, right side: Secondary | ICD-10-CM

## 2020-04-23 DIAGNOSIS — M5442 Lumbago with sciatica, left side: Secondary | ICD-10-CM | POA: Diagnosis not present

## 2020-04-23 DIAGNOSIS — G8929 Other chronic pain: Secondary | ICD-10-CM

## 2020-04-23 DIAGNOSIS — R269 Unspecified abnormalities of gait and mobility: Secondary | ICD-10-CM

## 2020-04-23 DIAGNOSIS — M6281 Muscle weakness (generalized): Secondary | ICD-10-CM

## 2020-04-23 NOTE — Therapy (Addendum)
[place] [facility] [address] [place], [place], [address] Phone: [phone]   Fax:  [phone]  Physical Therapy Treatment  Patient Details  Name: Donna Liu MRN: [id number] Date of Birth: 12-Mar-1966 Referring Provider (PT): Tessa Lerner, PA-C   Encounter Date: 04/21/2020   PT End of Session - 04/23/20 1546    Visit Number 3    Number of Visits 9    Date for PT Re-Evaluation 05/06/20    Authorization - Visit Number 3    Authorization - Number of Visits 10    PT Start Time 1025    PT Stop Time 1122    PT Time Calculation (min) 57 min    Activity Tolerance Patient tolerated treatment well;Patient limited by pain    Behavior During Therapy [facility] for tasks assessed/performed           Past Medical History:  Diagnosis Date  . Acid reflux   . Hypertension     Past Surgical History:  Procedure Laterality Date  . ABDOMINAL HYSTERECTOMY    . APPENDECTOMY    . CHOLECYSTECTOMY    . FOOT SURGERY Right   . GASTRIC BYPASS    . TONSILLECTOMY      There were no vitals filed for this visit.    Pt. reports not sleeping well last night. Pt. states she has remained busy with daily activity.     Manual tx.:  Supine LE/lumbar generalized stretching program (see flowsheet) No STM today.    There.ex.:  Reassessment of standing lumbar AROM (all planes) Supine SLR/ clamshell with RTB/ marching with RTB/ bicycles/ bridging (short hold time). Walking in clinic with cuing for posture/ arm swing Reassessment of HEP.        PT Long Term Goals - 06/30/19 1508      PT LONG TERM GOAL #1   Title Pt. I with HEP to increase R hip AROM (all planes) to WNL as compared to L to improve pain-free mobility.    Baseline I with HEP    Time 6    Period Weeks    Status Achieved    Target Date 06/27/19      PT LONG TERM GOAL #2   Title Pt. will report no R hip/ proximal ITB tenderness with palpation to improve pain-free mobility.      Baseline Tenderness present but improved.    Time 6    Period Weeks    Status Partially Met    Target Date 06/27/19      PT LONG TERM GOAL #3   Title Pt. able to ambulate with normalized gait pattern without assistive device and no c/o R hip pain to improve mobility.    Baseline Marked improvement in gait pattern    Time 6    Period Weeks    Status Achieved    Target Date 06/27/19      PT LONG TERM GOAL #4   Title Pt. will increase FOTO to 72 to improve pain-free functional mobility.    Baseline Initial FOTO: 63    Time 6    Period Weeks    Status Unable to assess    Target Date 06/27/19            Pt. benefits from [place] to low back/ hips to manage pain symptoms and increase joint mobility. Pt. ambulates with a slight R antalgic gait pattern, esp. with initial steps after lying down or sitting. Good B hamstring flexibility with (+) tenderness  during piriformis/ITB palpation. Discussed benefits/ use of personal TENS unit for chronic pain mgmt. with daily/ household tasks. No change to HEP at this time.      Patient will benefit from skilled therapeutic intervention in order to improve the following deficits and impairments:     Visit Diagnosis: Chronic bilateral low back pain with bilateral sciatica  Muscle weakness (generalized)  Pain in right hip  Muscle weakness  Gait difficulty     Problem List There are no problems to display for this patient.  Pura Spice, PT, DPT # [phone] 04/23/2020, 3:47 PM  [place] [facility] [address] [place], [place], [address] Phone: [phone]   Fax:  [phone]  Name: Donna Liu MRN: [id number] Date of Birth: September 26, 1966

## 2020-04-23 NOTE — Therapy (Addendum)
[facility] [address] [place], [place], [address] Phone: [phone]   Fax:  [phone]  Physical Therapy Treatment  Patient Details  Name: Donna Liu MRN: [id number] Date of Birth: 1965-10-29 Referring Provider (PT): Tessa Lerner, PA-C   Encounter Date: 04/23/2020   PT End of Session - 04/23/20 1548    Visit Number 4    Number of Visits 9    Date for PT Re-Evaluation 05/06/20    Authorization - Visit Number 4    Authorization - Number of Visits 10    PT Start Time 0928    PT Stop Time 1020    PT Time Calculation (min) 52 min    Activity Tolerance Patient tolerated treatment well;Patient limited by pain    Behavior During Therapy [facility] for tasks assessed/performed           Past Medical History:  Diagnosis Date  . Acid reflux   . Hypertension     Past Surgical History:  Procedure Laterality Date  . ABDOMINAL HYSTERECTOMY    . APPENDECTOMY    . CHOLECYSTECTOMY    . FOOT SURGERY Right   . GASTRIC BYPASS    . TONSILLECTOMY      There were no vitals filed for this visit.       No new complaints. Pt. has been taking care of grandchild today.     There.ex.:  Nustep L4 B UE/LE 10 min. with use of MH.  Discussed daily activity. Seated blue ball ex.: wt. Shifting/ marching/ alt. UE and LE/ posture correction  Manual tx.:  Supine/ sidelying/ prone generalized stretching (see flowsheet).     PT Long Term Goals - 06/30/19 1508      PT LONG TERM GOAL #1   Title Pt. I with HEP to increase R hip AROM (all planes) to WNL as compared to L to improve pain-free mobility.    Baseline I with HEP    Time 6    Period Weeks    Status Achieved    Target Date 06/27/19      PT LONG TERM GOAL #2   Title Pt. will report no R hip/ proximal ITB tenderness with palpation to improve pain-free mobility.     Baseline Tenderness present but improved.    Time 6    Period Weeks    Status Partially Met    Target  Date 06/27/19      PT LONG TERM GOAL #3   Title Pt. able to ambulate with normalized gait pattern without assistive device and no c/o R hip pain to improve mobility.    Baseline Marked improvement in gait pattern    Time 6    Period Weeks    Status Achieved    Target Date 06/27/19      PT LONG TERM GOAL #4   Title Pt. will increase FOTO to 72 to improve pain-free functional mobility.    Baseline Initial FOTO: 63    Time 6    Period Weeks    Status Unable to assess    Target Date 06/27/19            Good cadence/ upright posture on Nustep prior to supine ex./ manual tx. Generalized soft tissue tenderness in lumbar/hip musculature and able to tolerate Hypervolt on low setting. No hip or lumbar mobilizations today. Pt. will continue with daily activity/ pool ex.       Patient will benefit from skilled therapeutic intervention in order  to improve the following deficits and impairments:     Visit Diagnosis: Chronic bilateral low back pain with bilateral sciatica  Muscle weakness (generalized)  Pain in right hip  Muscle weakness  Gait difficulty     Problem List There are no problems to display for this patient.  Pura Spice, PT, DPT # [phone] 04/23/2020, 3:50 PM  [place] [facility] [address] [place], [place], [address] Phone: [phone]   Fax:  [phone]  Name: Donna Liu MRN: [id number] Date of Birth: 1966-02-08

## 2020-04-28 ENCOUNTER — Ambulatory Visit: Payer: Managed Care, Other (non HMO) | Admitting: Physical Therapy

## 2020-04-28 ENCOUNTER — Other Ambulatory Visit: Payer: Self-pay

## 2020-04-28 DIAGNOSIS — M6281 Muscle weakness (generalized): Secondary | ICD-10-CM

## 2020-04-28 DIAGNOSIS — M5442 Lumbago with sciatica, left side: Secondary | ICD-10-CM | POA: Diagnosis not present

## 2020-04-28 DIAGNOSIS — G8929 Other chronic pain: Secondary | ICD-10-CM

## 2020-04-30 ENCOUNTER — Encounter: Payer: Managed Care, Other (non HMO) | Admitting: Physical Therapy

## 2020-05-01 NOTE — Therapy (Signed)
[place] [facility] [address] [place], [place], [address] Phone: [phone]   Fax:  [phone]  Physical Therapy Treatment  Patient Details  Name: Donna Liu MRN: [id number] Date of Birth: 1966-02-09 Referring Provider (PT): Tessa Lerner, PA-C   Encounter Date: 04/28/2020   PT End of Session - 05/01/20 1225    Visit Number 5    Number of Visits 9    Date for PT Re-Evaluation 05/06/20    Authorization - Visit Number 5    Authorization - Number of Visits 10    PT Start Time 1020    PT Stop Time 1112    PT Time Calculation (min) 52 min    Activity Tolerance Patient tolerated treatment well    Behavior During Therapy [facility] for tasks assessed/performed           Past Medical History:  Diagnosis Date  . Acid reflux   . Hypertension     Past Surgical History:  Procedure Laterality Date  . ABDOMINAL HYSTERECTOMY    . APPENDECTOMY    . CHOLECYSTECTOMY    . FOOT SURGERY Right   . GASTRIC BYPASS    . TONSILLECTOMY      There were no vitals filed for this visit.                                   PT Long Term Goals - 06/30/19 1508      PT LONG TERM GOAL #1   Title Pt. I with HEP to increase R hip AROM (all planes) to WNL as compared to L to improve pain-free mobility.    Baseline I with HEP    Time 6    Period Weeks    Status Achieved    Target Date 06/27/19      PT LONG TERM GOAL #2   Title Pt. will report no R hip/ proximal ITB tenderness with palpation to improve pain-free mobility.     Baseline Tenderness present but improved.    Time 6    Period Weeks    Status Partially Met    Target Date 06/27/19      PT LONG TERM GOAL #3   Title Pt. able to ambulate with normalized gait pattern without assistive device and no c/o R hip pain to improve mobility.    Baseline Marked improvement in gait pattern    Time 6    Period Weeks    Status Achieved    Target Date 06/27/19      PT  LONG TERM GOAL #4   Title Pt. will increase FOTO to 72 to improve pain-free functional mobility.    Baseline Initial FOTO: 63    Time 6    Period Weeks    Status Unable to assess    Target Date 06/27/19                  Patient will benefit from skilled therapeutic intervention in order to improve the following deficits and impairments:     Visit Diagnosis: Chronic bilateral low back pain with bilateral sciatica  Muscle weakness (generalized)     Problem List There are no problems to display for this patient.   Pura Spice 05/01/2020, 12:27 PM  [place] [facility] [address] [place], [place], [address] Phone: [phone]   Fax:  [phone]  Name: Donna Liu  Bejarano MRN: [id number] Date of Birth: 1966-02-27

## 2020-05-05 ENCOUNTER — Ambulatory Visit: Payer: Managed Care, Other (non HMO) | Admitting: Physical Therapy

## 2020-05-05 ENCOUNTER — Other Ambulatory Visit: Payer: Self-pay

## 2020-05-05 DIAGNOSIS — R269 Unspecified abnormalities of gait and mobility: Secondary | ICD-10-CM

## 2020-05-05 DIAGNOSIS — M5442 Lumbago with sciatica, left side: Secondary | ICD-10-CM | POA: Diagnosis not present

## 2020-05-05 DIAGNOSIS — M6281 Muscle weakness (generalized): Secondary | ICD-10-CM

## 2020-05-05 DIAGNOSIS — G8929 Other chronic pain: Secondary | ICD-10-CM

## 2020-05-05 DIAGNOSIS — M25551 Pain in right hip: Secondary | ICD-10-CM

## 2020-05-07 ENCOUNTER — Ambulatory Visit: Payer: Managed Care, Other (non HMO) | Admitting: Physical Therapy

## 2020-05-12 ENCOUNTER — Ambulatory Visit: Payer: Managed Care, Other (non HMO) | Admitting: Physical Therapy

## 2020-05-12 ENCOUNTER — Other Ambulatory Visit: Payer: Self-pay

## 2020-05-12 DIAGNOSIS — M5442 Lumbago with sciatica, left side: Secondary | ICD-10-CM

## 2020-05-12 DIAGNOSIS — M6281 Muscle weakness (generalized): Secondary | ICD-10-CM

## 2020-05-12 DIAGNOSIS — M25551 Pain in right hip: Secondary | ICD-10-CM

## 2020-05-12 DIAGNOSIS — G8929 Other chronic pain: Secondary | ICD-10-CM

## 2020-05-12 NOTE — Therapy (Signed)
[facility] [address] [place], [place], [address] Phone: [phone]   Fax:  [phone]  Physical Therapy Treatment  Patient Details  Name: Donna Liu MRN: [id number] Date of Birth: 05-Apr-1966 Referring Provider (PT): Tessa Lerner, PA-C   Encounter Date: 05/05/2020   PT End of Session - 05/12/20 0708    Visit Number 6    Number of Visits 9    Date for PT Re-Evaluation 05/06/20    Authorization - Visit Number 6    Authorization - Number of Visits 10    PT Start Time 1028    PT Stop Time 6433    PT Time Calculation (min) 63 min    Activity Tolerance Patient tolerated treatment well    Behavior During Therapy [facility] for tasks assessed/performed           Past Medical History:  Diagnosis Date  . Acid reflux   . Hypertension     Past Surgical History:  Procedure Laterality Date  . ABDOMINAL HYSTERECTOMY    . APPENDECTOMY    . CHOLECYSTECTOMY    . FOOT SURGERY Right   . GASTRIC BYPASS    . TONSILLECTOMY      There were no vitals filed for this visit.                                   PT Long Term Goals - 06/30/19 1508      PT LONG TERM GOAL #1   Title Pt. I with HEP to increase R hip AROM (all planes) to WNL as compared to L to improve pain-free mobility.    Baseline I with HEP    Time 6    Period Weeks    Status Achieved    Target Date 06/27/19      PT LONG TERM GOAL #2   Title Pt. will report no R hip/ proximal ITB tenderness with palpation to improve pain-free mobility.     Baseline Tenderness present but improved.    Time 6    Period Weeks    Status Partially Met    Target Date 06/27/19      PT LONG TERM GOAL #3   Title Pt. able to ambulate with normalized gait pattern without assistive device and no c/o R hip pain to improve mobility.    Baseline Marked improvement in gait pattern    Time 6    Period Weeks    Status Achieved    Target Date 06/27/19      PT  LONG TERM GOAL #4   Title Pt. will increase FOTO to 72 to improve pain-free functional mobility.    Baseline Initial FOTO: 63    Time 6    Period Weeks    Status Unable to assess    Target Date 06/27/19                  Patient will benefit from skilled therapeutic intervention in order to improve the following deficits and impairments:     Visit Diagnosis: Chronic bilateral low back pain with bilateral sciatica  Muscle weakness (generalized)  Pain in right hip  Muscle weakness  Gait difficulty     Problem List There are no problems to display for this patient.   Pura Spice 05/12/2020, 7:10 AM  [place] [facility] [address] [place], [place], [address]  Phone: [phone]   Fax:  [phone]  Name: JOANY KHATIB MRN: [id number] Date of Birth: 1965/12/11

## 2020-05-13 NOTE — Therapy (Signed)
[place] [facility] [address] [place], [place], [address] Phone: [phone]   Fax:  [phone]  Physical Therapy Treatment  Patient Details  Name: Donna Liu MRN: [id number] Date of Birth: 1966-04-03 Referring Provider (PT): Tessa Lerner, PA-C   Encounter Date: 05/12/2020   PT End of Session - 05/13/20 0704    Visit Number 7    Number of Visits 11    Date for PT Re-Evaluation 06/09/20    Authorization - Visit Number 1    Authorization - Number of Visits 10    PT Start Time 1346    PT Stop Time 1434    PT Time Calculation (min) 48 min    Activity Tolerance Patient tolerated treatment well    Behavior During Therapy [facility] for tasks assessed/performed           Past Medical History:  Diagnosis Date  . Acid reflux   . Hypertension     Past Surgical History:  Procedure Laterality Date  . ABDOMINAL HYSTERECTOMY    . APPENDECTOMY    . CHOLECYSTECTOMY    . FOOT SURGERY Right   . GASTRIC BYPASS    . TONSILLECTOMY      There were no vitals filed for this visit.                                   PT Long Term Goals - 06/30/19 1508      PT LONG TERM GOAL #1   Title Pt. I with HEP to increase R hip AROM (all planes) to WNL as compared to L to improve pain-free mobility.    Baseline I with HEP    Time 6    Period Weeks    Status Achieved    Target Date 06/27/19      PT LONG TERM GOAL #2   Title Pt. will report no R hip/ proximal ITB tenderness with palpation to improve pain-free mobility.     Baseline Tenderness present but improved.    Time 6    Period Weeks    Status Partially Met    Target Date 06/27/19      PT LONG TERM GOAL #3   Title Pt. able to ambulate with normalized gait pattern without assistive device and no c/o R hip pain to improve mobility.    Baseline Marked improvement in gait pattern    Time 6    Period Weeks    Status Achieved    Target Date 06/27/19       PT LONG TERM GOAL #4   Title Pt. will increase FOTO to 72 to improve pain-free functional mobility.    Baseline Initial FOTO: 63    Time 6    Period Weeks    Status Unable to assess    Target Date 06/27/19                  Patient will benefit from skilled therapeutic intervention in order to improve the following deficits and impairments:     Visit Diagnosis: Chronic bilateral low back pain with bilateral sciatica  Muscle weakness (generalized)  Pain in right hip     Problem List There are no problems to display for this patient.   Pura Spice 05/13/2020, 7:06 AM  [place] [facility] [address] [place], [place], [address] Phone: [phone]   Fax:  [phone]  Name: Donna Liu MRN: [id number] Date of Birth: 10-03-1966

## 2020-05-14 ENCOUNTER — Ambulatory Visit: Payer: Managed Care, Other (non HMO) | Admitting: Physical Therapy

## 2020-05-19 ENCOUNTER — Ambulatory Visit: Payer: Managed Care, Other (non HMO) | Admitting: Physical Therapy

## 2020-05-26 ENCOUNTER — Ambulatory Visit: Payer: Managed Care, Other (non HMO) | Attending: Physician Assistant | Admitting: Physical Therapy

## 2020-05-26 ENCOUNTER — Other Ambulatory Visit: Payer: Self-pay

## 2020-05-26 DIAGNOSIS — M5442 Lumbago with sciatica, left side: Secondary | ICD-10-CM | POA: Insufficient documentation

## 2020-05-26 DIAGNOSIS — M25551 Pain in right hip: Secondary | ICD-10-CM | POA: Insufficient documentation

## 2020-05-26 DIAGNOSIS — M5441 Lumbago with sciatica, right side: Secondary | ICD-10-CM | POA: Insufficient documentation

## 2020-05-26 DIAGNOSIS — G8929 Other chronic pain: Secondary | ICD-10-CM | POA: Diagnosis present

## 2020-05-26 DIAGNOSIS — M6281 Muscle weakness (generalized): Secondary | ICD-10-CM | POA: Diagnosis present

## 2020-05-26 DIAGNOSIS — R269 Unspecified abnormalities of gait and mobility: Secondary | ICD-10-CM | POA: Diagnosis present

## 2020-05-28 ENCOUNTER — Encounter: Payer: Self-pay | Admitting: Physical Therapy

## 2020-05-28 NOTE — Therapy (Signed)
[place] [facility] [address]. [place], [place], [address] Phone: [phone]   Fax:  [phone]  Physical Therapy Treatment  Patient Details  Name: Donna Liu MRN: [id number] Date of Birth: 1966/01/25 Referring Provider (PT): Jennet Maduro, PA-C   Encounter Date: 05/26/2020   PT End of Session - 05/28/20 1623    Visit Number 8    Number of Visits 11    Date for PT Re-Evaluation 06/09/20    Authorization - Visit Number 2    Authorization - Number of Visits 10    PT Start Time 1344    PT Stop Time 1432    PT Time Calculation (min) 48 min    Activity Tolerance Patient tolerated treatment well    Behavior During Therapy [facility] for tasks assessed/performed           Past Medical History:  Diagnosis Date  . Acid reflux   . Hypertension     Past Surgical History:  Procedure Laterality Date  . ABDOMINAL HYSTERECTOMY    . APPENDECTOMY    . CHOLECYSTECTOMY    . FOOT SURGERY Right   . GASTRIC BYPASS    . TONSILLECTOMY      There were no vitals filed for this visit.                                   PT Long Term Goals - 04/10/20 1001      PT LONG TERM GOAL #1   Title Pt. I with HEP to increase lumbar flexion/ core stability to WNL to improve pain-free mobility.    Baseline Pt. currently not exercising.  Issued core program. (page #1-2)    Time 4    Period Weeks    Status New    Target Date 05/06/20      PT LONG TERM GOAL #2   Title Pt. will report no lumbar paraspinal muscle tenderness/ cramps with palpation to improve pain-free mobility.    Baseline Pt. reports muscle cramping in low back with increase activity/ swimming in pool    Time 4    Period Weeks    Status New    Target Date 05/06/20      PT LONG TERM GOAL #3   Title Pt. able to complete 30 minutes of household chores with no increase c/o back pain or radicular symptoms.    Baseline Increase c/o pain with activity    Time  4    Period Weeks    Status New    Target Date 05/06/20                  Patient will benefit from skilled therapeutic intervention in order to improve the following deficits and impairments:     Visit Diagnosis: Chronic bilateral low back pain with bilateral sciatica  Muscle weakness (generalized)  Pain in right hip  Muscle weakness     Problem List There are no problems to display for this patient.   Cammie Mcgee 05/28/2020, 4:25 PM  [place] [facility] [address] [place], [place], [address] Phone: [phone]   Fax:  [phone]  Name: Donna Liu MRN: [id number] Date of Birth: 10/16/1966

## 2020-06-02 ENCOUNTER — Other Ambulatory Visit: Payer: Self-pay

## 2020-06-02 ENCOUNTER — Ambulatory Visit: Payer: Managed Care, Other (non HMO) | Admitting: Physical Therapy

## 2020-06-02 DIAGNOSIS — M5442 Lumbago with sciatica, left side: Secondary | ICD-10-CM

## 2020-06-02 DIAGNOSIS — R269 Unspecified abnormalities of gait and mobility: Secondary | ICD-10-CM

## 2020-06-02 DIAGNOSIS — M6281 Muscle weakness (generalized): Secondary | ICD-10-CM

## 2020-06-02 DIAGNOSIS — G8929 Other chronic pain: Secondary | ICD-10-CM

## 2020-06-02 DIAGNOSIS — M25551 Pain in right hip: Secondary | ICD-10-CM

## 2020-06-09 ENCOUNTER — Encounter: Payer: Self-pay | Admitting: Physical Therapy

## 2020-06-09 NOTE — Therapy (Signed)
[place] [facility] [address] [place], [place], [address] Phone: [phone]   Fax:  [phone]  Physical Therapy Treatment  Patient Details  Name: Donna Liu MRN: [id number] Date of Birth: August 27, 1966 Referring Provider (PT): Jennet Maduro, PA-C   Encounter Date: 06/02/2020   PT End of Session - 06/09/20 1655    Visit Number 9    Number of Visits 11    Date for PT Re-Evaluation 06/09/20    Authorization - Visit Number 3    Authorization - Number of Visits 10    PT Start Time 1341    PT Stop Time 1428    PT Time Calculation (min) 47 min    Activity Tolerance Patient tolerated treatment well    Behavior During Therapy [facility] for tasks assessed/performed           Past Medical History:  Diagnosis Date  . Acid reflux   . Hypertension     Past Surgical History:  Procedure Laterality Date  . ABDOMINAL HYSTERECTOMY    . APPENDECTOMY    . CHOLECYSTECTOMY    . FOOT SURGERY Right   . GASTRIC BYPASS    . TONSILLECTOMY      There were no vitals filed for this visit.                                   PT Long Term Goals - 04/10/20 1001      PT LONG TERM GOAL #1   Title Pt. I with HEP to increase lumbar flexion/ core stability to WNL to improve pain-free mobility.    Baseline Pt. currently not exercising.  Issued core program. (page #1-2)    Time 4    Period Weeks    Status New    Target Date 05/06/20      PT LONG TERM GOAL #2   Title Pt. will report no lumbar paraspinal muscle tenderness/ cramps with palpation to improve pain-free mobility.    Baseline Pt. reports muscle cramping in low back with increase activity/ swimming in pool    Time 4    Period Weeks    Status New    Target Date 05/06/20      PT LONG TERM GOAL #3   Title Pt. able to complete 30 minutes of household chores with no increase c/o back pain or radicular symptoms.    Baseline Increase c/o pain with activity    Time  4    Period Weeks    Status New    Target Date 05/06/20                  Patient will benefit from skilled therapeutic intervention in order to improve the following deficits and impairments:     Visit Diagnosis: Chronic bilateral low back pain with bilateral sciatica  Muscle weakness (generalized)  Pain in right hip  Muscle weakness  Gait difficulty     Problem List There are no problems to display for this patient.   Cammie Mcgee 06/09/2020, 4:56 PM  [place] [facility] [address] [place], [place], [address] Phone: [phone]   Fax:  [phone]  Name: Donna Liu MRN: [id number] Date of Birth: [birth year]

## 2020-07-01 ENCOUNTER — Other Ambulatory Visit: Payer: Managed Care, Other (non HMO)

## 2020-07-01 ENCOUNTER — Other Ambulatory Visit: Payer: Self-pay

## 2020-07-01 DIAGNOSIS — Z20822 Contact with and (suspected) exposure to covid-19: Secondary | ICD-10-CM

## 2020-07-03 LAB — NOVEL CORONAVIRUS, NAA: SARS-CoV-2, NAA: NOT DETECTED

## 2020-07-03 LAB — SARS-COV-2, NAA 2 DAY TAT

## 2020-09-21 ENCOUNTER — Encounter: Payer: Self-pay | Admitting: Family Medicine

## 2020-09-21 ENCOUNTER — Other Ambulatory Visit: Payer: Self-pay

## 2020-09-21 ENCOUNTER — Ambulatory Visit (INDEPENDENT_AMBULATORY_CARE_PROVIDER_SITE_OTHER): Payer: Managed Care, Other (non HMO) | Admitting: Family Medicine

## 2020-09-21 VITALS — BP 135/74 | HR 88 | Temp 98.6°F | Resp 17 | Ht 69.0 in | Wt 202.6 lb

## 2020-09-21 DIAGNOSIS — R413 Other amnesia: Secondary | ICD-10-CM | POA: Insufficient documentation

## 2020-09-21 DIAGNOSIS — I10 Essential (primary) hypertension: Secondary | ICD-10-CM | POA: Diagnosis not present

## 2020-09-21 DIAGNOSIS — Z7689 Persons encountering health services in other specified circumstances: Secondary | ICD-10-CM | POA: Insufficient documentation

## 2020-09-21 DIAGNOSIS — F4310 Post-traumatic stress disorder, unspecified: Secondary | ICD-10-CM

## 2020-09-21 DIAGNOSIS — G90529 Complex regional pain syndrome I of unspecified lower limb: Secondary | ICD-10-CM

## 2020-09-21 DIAGNOSIS — F32A Depression, unspecified: Secondary | ICD-10-CM

## 2020-09-21 DIAGNOSIS — G9050 Complex regional pain syndrome I, unspecified: Secondary | ICD-10-CM | POA: Insufficient documentation

## 2020-09-21 DIAGNOSIS — E278 Other specified disorders of adrenal gland: Secondary | ICD-10-CM | POA: Insufficient documentation

## 2020-09-21 DIAGNOSIS — F419 Anxiety disorder, unspecified: Secondary | ICD-10-CM | POA: Insufficient documentation

## 2020-09-21 MED ORDER — LISINOPRIL 40 MG PO TABS: ORAL_TABLET | ORAL | 1 refills | Status: DC

## 2020-09-21 NOTE — Progress Notes (Signed)
Subjective:    Patient ID: [name], female    DOB: [birth year], 54 y.o.   MRN: [id number]  Donna Liu is a 54 y.o. female presenting on 09/21/2020 for Establish Care (f/u on blood pressure, pt notice that shes more forgetful and concern it could related to a head injury x 3 yrs ago. She state she was removing a blender off the top of the fridge and the top on the blender fell down and hit her on the Rt side of her forehead leaving a indentation in her head.)   HPI   [name] presents to clinic as a new patient to establish for primary care.  Previous PCP was at [facility] [address]  Records will be requested.  Past medical, family, and surgical history reviewed w/ pt.  She has acute concerns for memory issues, has noticed and been made aware of it by her spouse over the past few years.  Had been reaching for a blender on the top of the fridge, it had fallen and hit the right side of her upper forehead, leaving an indent.  Has not been seen by any provider for her concerns of memory loss or the injury.  Depression screen PHQ 2/9 09/21/2020  Decreased Interest 0  Down, Depressed, Hopeless 1  PHQ - 2 Score 1    Social History   Tobacco Use  . Smoking status: Current Every Day Smoker    Packs/day: 0.50    Years: 35.00    Pack years: 17.50    Types: Cigarettes  . Smokeless tobacco: Never Used  Vaping Use  . Vaping Use: Never used  Substance Use Topics  . Alcohol use: Yes    Comment: occasional   . Drug use: Never    Review of Systems  Constitutional: Negative.   HENT: Negative.   Eyes: Negative.   Respiratory: Negative.   Cardiovascular: Negative.   Gastrointestinal: Negative.   Endocrine: Negative.   Genitourinary: Negative.   Musculoskeletal: Negative.   Skin: Negative.   Allergic/Immunologic: Negative.   Neurological: Negative.   Hematological: Negative.   Psychiatric/Behavioral: Negative.        Memory concerns   Per HPI unless specifically  indicated above     Objective:    BP 135/74 (BP Location: Right Arm, Patient Position: Sitting, Cuff Size: Normal)   Pulse 88   Temp 98.6 F (37 C) (Oral)   Resp 17   Ht _0  (1.753 m)   Wt 202 lb 9.6 oz (91.9 kg)   SpO2 98%   BMI 29.92 kg/m   Wt Readings from Last 3 Encounters:  09/21/20 202 lb 9.6 oz (91.9 kg)  01/18/19 190 lb (86.2 kg)  11/24/15 174 lb (78.9 kg)    Physical Exam Vitals and nursing note reviewed.  Constitutional:      General: She is not in acute distress.    Appearance: Normal appearance. She is well-developed and well-groomed. She is not ill-appearing or toxic-appearing.  HENT:     Head: Normocephalic and atraumatic.     Nose:     Comments: Lizbeth Bark is in place, covering mouth and nose. Eyes:     General: Lids are normal. Vision grossly intact.        Right eye: No discharge.        Left eye: No discharge.     Extraocular Movements: Extraocular movements intact.     Conjunctiva/sclera: Conjunctivae normal.     Pupils: Pupils are equal, round, and reactive  to light.  Cardiovascular:     Rate and Rhythm: Normal rate and regular rhythm.     Pulses: Normal pulses.     Heart sounds: Normal heart sounds. No murmur heard.  No friction rub. No gallop.   Pulmonary:     Effort: Pulmonary effort is normal. No respiratory distress.     Breath sounds: Normal breath sounds.  Skin:    General: Skin is warm and dry.     Capillary Refill: Capillary refill takes less than 2 seconds.  Neurological:     General: No focal deficit present.     Mental Status: She is alert and oriented to person, place, and time.  Psychiatric:        Attention and Perception: Attention and perception normal.        Mood and Affect: Mood and affect normal.        Speech: Speech normal.        Behavior: Behavior normal. Behavior is cooperative.        Thought Content: Thought content normal.        Cognition and Memory: Cognition and memory normal.        Judgment: Judgment  normal.    Results for orders placed or performed in visit on 07/01/20  Novel Coronavirus, NAA ([facility])   Specimen: Nasopharyngeal(NP) swabs in vial transport medium   Nasopharynge  Screenin  Result Value Ref Range   SARS-CoV-2, NAA Not Detected Not Detected  SARS-COV-2, NAA 2 DAY TAT   Nasopharynge  Screenin  Result Value Ref Range   SARS-CoV-2, NAA 2 DAY TAT Performed       Assessment & Plan:   Problem List Items Addressed This Visit      Cardiovascular and Mediastinum   Hypertension - Primary    Reports previously on Lisinopril 13m in the AM and 283min the PM, was having an increased amount of caffeine daily at that time that she contributes the hypertension too.  Has been working towards reducing her caffeine intake and has not been currently taking the lisinopril prescription.  Plan: 1. Restart on lisinopril 4064maily 2. Begin checking blood pressure 1-2x per day for the next 2 weeks 3. We will re-evaluate your blood pressure, virtually, or in clinic, to see if we need to make any medication changes       Relevant Medications   lisinopril (ZESTRIL) 40 MG tablet     Other   Memory loss    Unsure if related to anxiety or head trauma x 3 years ago when blender fell onto head, leaving right sided forehead indentation.  Has not met with Neurology or had any evaluation for this since time of injury.  Has been finding her husband has been telling her that she has been increasingly forgetful with her memory lately.  Interested in meeting with Neurology.  Will place referral.      Relevant Orders   Ambulatory referral to Neurology   Encounter to establish care with new doctor    [facility]  Will RTC in 4 weeks for CPE and labs.      Ad[place]al nodule ([place]  Reports was an incidental finding and has been followed by [facility] in the past for re-evaluations as recommended by the radiology team.  Is unsure when she is due for her next  re-evaluation.  Will request copies of medical records and to discuss at CPE.      Fibromyalgia  Currently following with [facility], Danne Harbor      Relevant Medications   DULoxetine (CYMBALTA) 20 MG capsule   sertraline (ZOLOFT) 100 MG tablet   traZODone (DESYREL) 50 MG tablet   pregabalin (LYRICA) 50 MG capsule   traMADol (ULTRAM) 50 MG tablet   Anxiety    Currently following with Dr. Rogene Houston (Psychiatry) and [facility] Nira Conn) for her mental health diagnoses.      Relevant Medications   DULoxetine (CYMBALTA) 20 MG capsule   sertraline (ZOLOFT) 100 MG tablet   traZODone (DESYREL) 50 MG tablet   Depression    Currently following with Dr. Rogene Houston (Psychiatry) and [facility]ira Conn) for her mental health diagnoses.      Relevant Medications   DULoxetine (CYMBALTA) 20 MG capsule   sertraline (ZOLOFT) 100 MG tablet   traZODone (DESYREL) 50 MG tablet   PTSD (post-traumatic stress disorder)    Currently following with Dr. Rogene Houston (Psychiatry) and [facility]ira Conn) for her mental health diagnoses.      Relevant Medications   DULoxetine (CYMBALTA) 20 MG capsule   sertraline (ZOLOFT) 100 MG tablet   traZODone (DESYREL) 50 MG tablet      Meds ordered this encounter  Medications  . lisinopril (ZESTRIL) 40 MG tablet    Sig: Take 1 tablet daily    Dispense:  90 tablet    Refill:  1    Follow up plan: Return in about 2 weeks (around 10/05/2020) for 2 weeks for HTN F/U (Virtual) and 4 weeks for CPE.   Harlin Rain, [place] Family Nurse Practitioner [facility] 09/21/2020, 3:31 PM

## 2020-09-21 NOTE — Assessment & Plan Note (Signed)
Currently following with Dr. Narda Amber (Psychiatry) and [facility] Herbert Seta) for her mental health diagnoses.

## 2020-09-21 NOTE — Patient Instructions (Signed)
I have sent in a refill on your lisinopril and we will keep it at 40mg  daily.  We can plan to meet virtually in 2 weeks to recheck your blood pressure and see if any medication changes need to be made.  Try to get exercise a minimum of 30 minutes per day at least 5 days per week as well as  adequate water intake all while measuring blood pressure a few times per week.  Keep a blood pressure log and bring back to clinic at your next visit.  If your readings are consistently over 130/80 to contact our office/send me a MyChart message and we will see you sooner.  Can try DASH and Mediterranean diet options, avoiding processed foods, lowering sodium intake, avoiding pork products, and eating a plant based diet for optimal health.  We will plan to see you back in 2 weeks for hypertension follow up visit and 4 weeks for your physical and lab work  You will receive a survey after today's visit either digitally by e-mail or paper by . Your experiences and feedback matter to [employer].  Please respond so we know how we are doing as we provide care for you.  Call us with any questions/concerns/needs.  It is my goal to be available to you for your health concerns.  Thanks for choosing me to be a partner in your healthcare needs!  [place], FNP-C Family Nurse Practitioner [facility] Phone: [phone]

## 2020-09-21 NOTE — Assessment & Plan Note (Signed)
New patient establishment at [facility] for primary care.  Will RTC in 4 weeks for CPE and labs.

## 2020-09-21 NOTE — Assessment & Plan Note (Signed)
Currently following with [facility], Alicia Amel.  Has had 1 lidocaine infusion with good improvement in symptoms.

## 2020-09-21 NOTE — Assessment & Plan Note (Signed)
Reports was an incidental finding and has been followed by [facility] in the past for re-evaluations as recommended by the radiology team.  Is unsure when she is due for her next re-evaluation.  Will request copies of medical records and to discuss at CPE.

## 2020-09-21 NOTE — Assessment & Plan Note (Signed)
Currently following with Dr. Kapaur (Psychiatry) and [facility] ([place]) for her mental health diagnoses. 

## 2020-09-21 NOTE — Assessment & Plan Note (Signed)
Currently following with [facility], Alicia Amel

## 2020-09-21 NOTE — Assessment & Plan Note (Signed)
Reports previously on Lisinopril 40mg  in the AM and 20mg  in the PM, was having an increased amount of caffeine daily at that time that she contributes the hypertension too.  Has been working towards reducing her caffeine intake and has not been currently taking the lisinopril prescription.  Plan: 1. Restart on lisinopril 40mg  daily 2. Begin checking blood pressure 1-2x per day for the next 2 weeks 3. We will re-evaluate your blood pressure, virtually, or in clinic, to see if we need to make any medication changes

## 2020-09-21 NOTE — Assessment & Plan Note (Signed)
Unsure if related to anxiety or head trauma x 3 years ago when blender fell onto head, leaving right sided forehead indentation.  Has not met with Neurology or had any evaluation for this since time of injury.  Has been finding her husband has been telling her that she has been increasingly forgetful with her memory lately.  Interested in meeting with Neurology.  Will place referral. 

## 2020-10-05 ENCOUNTER — Other Ambulatory Visit: Payer: Self-pay

## 2020-10-05 ENCOUNTER — Telehealth (INDEPENDENT_AMBULATORY_CARE_PROVIDER_SITE_OTHER): Payer: Managed Care, Other (non HMO) | Admitting: Family Medicine

## 2020-10-05 ENCOUNTER — Encounter: Payer: Self-pay | Admitting: Family Medicine

## 2020-10-05 VITALS — BP 138/82 | HR 86

## 2020-10-05 DIAGNOSIS — I10 Essential (primary) hypertension: Secondary | ICD-10-CM | POA: Diagnosis not present

## 2020-10-05 MED ORDER — HYDROCHLOROTHIAZIDE 12.5 MG PO TABS: 12.5000 mg | ORAL_TABLET | Freq: Every day | ORAL | 0 refills | Status: DC

## 2020-10-05 NOTE — Assessment & Plan Note (Signed)
Uncontrolled hypertension.  BP is not at goal < 130/80.  Pt reports working on lifestyle modifications.  Taking medications tolerating well without side effects.   Plan: 1. Continue taking lisinopril 40mg  daily and BEGIN hydrochlorothiazide 12.5mg  daily 2. Obtain labs at next visit  3. Encouraged heart healthy diet and increasing exercise to 30 minutes most days of the week, going no more than 2 days in a row without exercise. 4. Check BP 1-2 x per week at home, keep log, and bring to clinic at next appointment. 5. Follow up 2 weeks.

## 2020-10-05 NOTE — Progress Notes (Signed)
Virtual Visit via Telephone  The purpose of this virtual visit is to provide medical care while limiting exposure to the novel coronavirus (COVID19) for both patient and office staff.  Consent was obtained for phone visit:  Yes.   Answered questions that patient had about telehealth interaction:  Yes.   I discussed the limitations, risks, security and privacy concerns of performing an evaluation and management service by telephone. I also discussed with the patient that there may be a patient responsible charge related to this service. The patient expressed understanding and agreed to proceed.  Patient is at home and is accessed via telephone Services are provided by Charlaine Dalton, FNP-C from [facility])  ---------------------------------------------------------------------- Chief Complaint  Patient presents with  . Hypertension    Pt reading 138/82, 84. Bp reading averaging 135/80    S: Reviewed CMA documentation. I have called patient and gathered additional HPI as follows:  Ms. Telleria presents for telemedicine visit via telephone for follow up on her hypertension.  Reports blood pressure has been averaging 135/80 and has been tolerating the lisinopril well.  Denies any headaches, visual changes, palpitations, chest pain, leg swelling.  Patient is currently home Denies any high risk travel to areas of current concern for COVID19. Denies any known or suspected exposure to person with or possibly with COVID19.   Past Medical History:  Diagnosis Date  . Acid reflux   . Adrenal nodule (HCC)   . Allergy   . Anxiety   . CRPS (complex regional pain syndrome type I)   . Depression   . Fibromyalgia   . Hypertension   . PTSD (post-traumatic stress disorder)    Social History   Tobacco Use  . Smoking status: Current Every Day Smoker    Packs/day: 0.50    Years: 35.00    Pack years: 17.50    Types: Cigarettes  . Smokeless tobacco: Never Used   Vaping Use  . Vaping Use: Never used  Substance Use Topics  . Alcohol use: Yes    Comment: occasional   . Drug use: Never    Current Outpatient Medications:  .  DULoxetine (CYMBALTA) 20 MG capsule, Take 20 mg by mouth 2 (two) times daily., Disp: , Rfl:  .  lisinopril (ZESTRIL) 40 MG tablet, Take 1 tablet daily, Disp: 90 tablet, Rfl: 1 .  pregabalin (LYRICA) 50 MG capsule, pregabalin 50 mg capsule  TAKE 1 CAPSULE IN THE MORNING AND 2 CAPSULES IN THE EVENING, OK TO TAKE EXTRA 50 MG DURING FLARES, Disp: , Rfl:  .  sertraline (ZOLOFT) 100 MG tablet, Take 150 mg by mouth daily., Disp: , Rfl:  .  traMADol (ULTRAM) 50 MG tablet, Take by mouth every 4 (four) hours as needed., Disp: , Rfl:  .  traZODone (DESYREL) 50 MG tablet, trazodone 50 mg tablet  START WITH 1 TABLET BY MOUTH NIGHTLY AND CAN INCREASE TO 2 TABLETS NIGHTLY AS NEEDED, Disp: , Rfl:  .  hydrochlorothiazide (HYDRODIURIL) 12.5 MG tablet, Take 1 tablet (12.5 mg total) by mouth daily., Disp: 30 tablet, Rfl: 0  Depression screen PHQ 2/9 09/21/2020  Decreased Interest 0  Down, Depressed, Hopeless 1  PHQ - 2 Score 1    No flowsheet data found.  -------------------------------------------------------------------------- O: No physical exam performed due to remote telephone encounter.  Physical Exam: Patient remotely monitored without video.  Verbal communication appropriate.  Cognition normal.  No results found for this or any previous visit (from the past 2160 hour(s)).  --------------------------------------------------------------------------  A&P:  Problem List Items Addressed This Visit      Cardiovascular and Mediastinum   Hypertension - Primary    Uncontrolled hypertension.  BP is not at goal < 130/80.  Pt reports working on lifestyle modifications.  Taking medications tolerating well without side effects.   Plan: 1. Continue taking lisinopril 40mg  daily and BEGIN hydrochlorothiazide 12.5mg  daily 2. Obtain labs at next  visit  3. Encouraged heart healthy diet and increasing exercise to 30 minutes most days of the week, going no more than 2 days in a row without exercise. 4. Check BP 1-2 x per week at home, keep log, and bring to clinic at next appointment. 5. Follow up 2 weeks.       Relevant Medications   hydrochlorothiazide (HYDRODIURIL) 12.5 MG tablet      Meds ordered this encounter  Medications  . hydrochlorothiazide (HYDRODIURIL) 12.5 MG tablet    Sig: Take 1 tablet (12.5 mg total) by mouth daily.    Dispense:  30 tablet    Refill:  0    Follow-up: - Return in 2 weeks for hypertension re-evaluation  Patient verbalizes understanding with the above medical recommendations including the limitation of remote medical advice.  Specific follow-up and call-back criteria were given for patient to follow-up or seek medical care more urgently if needed.  - Time spent in direct consultation with patient on phone: 6 minutes  , FNP-C [facility] 10/05/2020, 12:13 PM

## 2020-10-17 ENCOUNTER — Encounter: Payer: Self-pay | Admitting: Family Medicine

## 2020-10-20 ENCOUNTER — Emergency Department: Payer: Managed Care, Other (non HMO)

## 2020-10-20 ENCOUNTER — Encounter: Payer: Self-pay | Admitting: *Deleted

## 2020-10-20 ENCOUNTER — Other Ambulatory Visit: Payer: Self-pay

## 2020-10-20 ENCOUNTER — Emergency Department
Admission: EM | Admit: 2020-10-20 | Discharge: 2020-10-20 | Disposition: A | Discharge status: Home / Self Care | Payer: Managed Care, Other (non HMO) | Attending: Student in an Organized Health Care Education/Training Program | Admitting: Student in an Organized Health Care Education/Training Program

## 2020-10-20 DIAGNOSIS — I10 Essential (primary) hypertension: Secondary | ICD-10-CM | POA: Diagnosis not present

## 2020-10-20 DIAGNOSIS — F17210 Nicotine dependence, cigarettes, uncomplicated: Secondary | ICD-10-CM | POA: Diagnosis not present

## 2020-10-20 DIAGNOSIS — Z7901 Long term (current) use of anticoagulants: Secondary | ICD-10-CM | POA: Insufficient documentation

## 2020-10-20 DIAGNOSIS — I82412 Acute embolism and thrombosis of left femoral vein: Secondary | ICD-10-CM | POA: Diagnosis not present

## 2020-10-20 DIAGNOSIS — Z79899 Other long term (current) drug therapy: Secondary | ICD-10-CM | POA: Insufficient documentation

## 2020-10-20 DIAGNOSIS — M7989 Other specified soft tissue disorders: Secondary | ICD-10-CM

## 2020-10-20 DIAGNOSIS — M79662 Pain in left lower leg: Secondary | ICD-10-CM

## 2020-10-20 LAB — BASIC METABOLIC PANEL
Anion gap: 9 (ref 5–15)
BUN: 13 mg/dL (ref 6–20)
CO2: 27 mmol/L (ref 22–32)
Calcium: 9.4 mg/dL (ref 8.9–10.3)
Chloride: 102 mmol/L (ref 98–111)
Creatinine, Ser: 0.63 mg/dL (ref 0.44–1.00)
GFR, Estimated: >60 mL/min (ref >60)
Glucose, Bld: 145 mg/dL — ABNORMAL HIGH (ref 70–99)
Potassium: 4.6 mmol/L (ref 3.5–5.1)
Sodium: 138 mmol/L (ref 135–145)

## 2020-10-20 LAB — CBC
HCT: 41.1 % (ref 36.0–46.0)
Hemoglobin: 13.7 g/dL (ref 12.0–15.0)
MCH: 31.1 pg (ref 26.0–34.0)
MCHC: 33.3 g/dL (ref 30.0–36.0)
MCV: 93.4 fL (ref 80.0–100.0)
Platelets: 270 10*3/uL (ref 150–400)
RBC: 4.4 MIL/uL (ref 3.87–5.11)
RDW: 12.8 % (ref 11.5–15.5)
WBC: 9.9 10*3/uL (ref 4.0–10.5)
nRBC: 0 % (ref 0.0–0.2)

## 2020-10-20 MED ORDER — APIXABAN (ELIQUIS) VTE STARTER PACK (10MG AND 5MG): ORAL_TABLET | ORAL | 0 refills | Status: DC

## 2020-10-20 MED ORDER — APIXABAN 5 MG PO TABS: 10.0000 mg | ORAL_TABLET | Freq: Two times a day (BID) | ORAL | Status: DC

## 2020-10-20 MED ORDER — APIXABAN 5 MG PO TABS
10.0000 mg | ORAL_TABLET | Freq: Two times a day (BID) | ORAL | Status: DC
  Administered 2020-10-20: 10 mg via ORAL
  Filled 2020-10-20: qty 2

## 2020-10-20 MED ORDER — APIXABAN 5 MG PO TABS: 5.0000 mg | ORAL_TABLET | Freq: Two times a day (BID) | ORAL | Status: DC

## 2020-10-20 NOTE — ED Provider Notes (Signed)
[facility] Emergency Department Provider Note    Event Date/Time   First MD Initiated Contact with Patient 10/20/20 1804     (approximate)  I have reviewed the triage vital signs and the nursing notes.   HISTORY  Chief Complaint No chief complaint on file.    HPI Donna Liu is a 55 y.o. female with the below listed past medical history presents to the ER for evaluation of possible blood clot that was diagnosed on outpatient MRI performed at [facility] today for evaluation of tibial plateau fracture.  States has been immobilizer for more than a week.  Denies any worsening pain or significant swelling.  No previous history of DVTs.  Denies any chest pain or shortness of breath.    Past Medical History:  Diagnosis Date  . Acid reflux   . Adrenal nodule (HCC)   . Allergy   . Anxiety   . CRPS (complex regional pain syndrome type I)   . Depression   . Fibromyalgia   . Hypertension   . PTSD (post-traumatic stress disorder)    Family History  Problem Relation Age of Onset  . Hypertension Mother   . Hyperlipidemia Mother   . Atrial fibrillation Mother   . Healthy Father    Past Surgical History:  Procedure Laterality Date  . ABDOMINAL HYSTERECTOMY    . APPENDECTOMY    . CHOLECYSTECTOMY    . FOOT SURGERY Right   . GASTRIC BYPASS    . TONSILLECTOMY     Patient Active Problem List   Diagnosis Date Noted  . Hypertension 09/21/2020  . Memory loss 09/21/2020  . Encounter to establish care with new doctor 09/21/2020  . Adrenal nodule (HCC) 09/21/2020  . Anxiety 09/21/2020  . Depression 09/21/2020  . PTSD (post-traumatic stress disorder) 09/21/2020  . CRPS (complex regional pain syndrome type I)   . Enthesopathy of right hip 01/29/2016  . Status post bariatric surgery 02/17/2014  . Fibromyalgia 05/29/2013  . Mixed hyperlipidemia 04/22/2011      Prior to Admission medications   Medication Sig Start Date End Date Taking?  Authorizing Provider  APIXABAN Everlene Balls) VTE STARTER PACK (10MG  AND 5MG ) Take as directed on package: start with two-5mg  tablets twice daily for 7 days. On day 8, switch to one-5mg  tablet twice daily. 10/20/20  Yes , MD  DULoxetine (CYMBALTA) 20 MG capsule Take 20 mg by mouth 2 (two) times daily. 08/10/20   [provider]  hydrochlorothiazide (HYDRODIURIL) 12.5 MG tablet Take 1 tablet (12.5 mg total) by mouth daily. 10/05/20   Malfi, 08/12/20, FNP  lisinopril (ZESTRIL) 40 MG tablet Take 1 tablet daily 09/21/20   Malfi, Jodelle Gross, FNP  pregabalin (LYRICA) 50 MG capsule pregabalin 50 mg capsule  TAKE 1 CAPSULE IN THE MORNING AND 2 CAPSULES IN THE EVENING, OK TO TAKE EXTRA 50 MG DURING FLARES    [provider]  sertraline (ZOLOFT) 100 MG tablet Take 150 mg by mouth daily. 09/21/17   [provider]  traMADol (ULTRAM) 50 MG tablet Take by mouth every 4 (four) hours as needed.    [provider]  traZODone (DESYREL) 50 MG tablet trazodone 50 mg tablet  START WITH 1 TABLET BY MOUTH NIGHTLY AND CAN INCREASE TO 2 TABLETS NIGHTLY AS NEEDED 06/11/20   [provider]    Allergies Penicillins, Tapentadol, Sulfur, Oxycodone-acetaminophen, and Sulfa antibiotics    Social History Social History   Tobacco Use  . Smoking status: Current  Every Day Smoker    Packs/day: 0.50    Years: 35.00    Pack years: 17.50    Types: Cigarettes  . Smokeless tobacco: Never Used  Vaping Use  . Vaping Use: Never used  Substance Use Topics  . Alcohol use: Not Currently    Comment: occasional   . Drug use: Never    Review of Systems Patient denies headaches, rhinorrhea, blurry vision, numbness, shortness of breath, chest pain, edema, cough, abdominal pain, nausea, vomiting, diarrhea, dysuria, fevers, rashes or hallucinations unless otherwise stated above in HPI. ____________________________________________   PHYSICAL EXAM:  VITAL SIGNS: Vitals:    10/20/20 1659  BP: (!) 149/99  Pulse: 89  Resp: 18  Temp: 98 F (36.7 C)  SpO2: 100%    Constitutional: Alert and oriented.  Eyes: Conjunctivae are normal.  Head: Atraumatic. Nose: No congestion/rhinnorhea. Mouth/Throat: Mucous membranes are moist.   Neck: No stridor. Painless ROM.  Cardiovascular: Normal rate, regular rhythm. Grossly normal heart sounds.  Good peripheral circulation. Respiratory: Normal respiratory effort.  No retractions. Lungs CTAB. Gastrointestinal: Soft and nontender. No distention. No abdominal bruits. No CVA tenderness. Genitourinary:  Musculoskeletal: llue in immobilizer,  Trace LLE edema.  No joint effusions. Neurologic:  Normal speech and language. No gross focal neurologic deficits are appreciated. No facial droop Skin:  Skin is warm, dry and intact. No rash noted. Psychiatric: Mood and affect are normal. Speech and behavior are normal.  ____________________________________________   LABS (all labs ordered are listed, but only abnormal results are displayed)  Results for orders placed or performed during the hospital encounter of 10/20/20 (from the past 24 hour(s))  Basic metabolic panel     Status: Abnormal   Collection Time: 10/20/20  5:06 PM  Result Value Ref Range   Sodium 138 135 - 145 mmol/L   Potassium 4.6 3.5 - 5.1 mmol/L   Chloride 102 98 - 111 mmol/L   CO2 27 22 - 32 mmol/L   Glucose, Bld 145 (H) 70 - 99 mg/dL   BUN 13 6 - 20 mg/dL   Creatinine, Ser 0.63 0.44 - 1.00 mg/dL   Calcium 9.4 8.9 - 10.3 mg/dL   GFR, Estimated >60 >60 mL/min   Anion gap 9 5 - 15  CBC     Status: None   Collection Time: 10/20/20  5:06 PM  Result Value Ref Range   WBC 9.9 4.0 - 10.5 K/uL   RBC 4.40 3.87 - 5.11 MIL/uL   Hemoglobin 13.7 12.0 - 15.0 g/dL   HCT 41.1 36.0 - 46.0 %   MCV 93.4 80.0 - 100.0 fL   MCH 31.1 26.0 - 34.0 pg   MCHC 33.3 30.0 - 36.0 g/dL   RDW 12.8 11.5 - 15.5 %   Platelets 270 150 - 400 K/uL   nRBC 0.0 0.0 - 0.2 %    ____________________________________________ ____________________________________________  RADIOLOGY  I personally reviewed all radiographic images ordered to evaluate for the above acute complaints and reviewed radiology reports and findings.  These findings were personally discussed with the patient.  Please see medical record for radiology report.  ____________________________________________   PROCEDURES  Procedure(s) performed:  Procedures    Critical Care performed: no ____________________________________________   INITIAL IMPRESSION / ASSESSMENT AND PLAN / ED COURSE  Pertinent labs & imaging results that were available during my care of the patient were reviewed by me and considered in my medical decision making (see chart for details).   DDX: dvt, edema, fracture  [name]  is a 55 y.o. who presents to the ED with evidence of acute DVT in the setting of recent lower extremity injury and immobilization.  She is well perfused.  No cerulea dolens.  Does appear appropriate for outpatient treatment and referral to vascular.  No contraindications to anticoagulation.  Will be started on Eliquis given first dose here.  She denies any chest pain or shortness of breath or any findings to suggest PE.  Patient stable and appropriate for outpatient follow-up.    The patient was evaluated in Emergency Department today for the symptoms described in the history of present illness. He/she was evaluated in the context of the global COVID-19 pandemic, which necessitated consideration that the patient might be at risk for infection with the SARS-CoV-2 virus that causes COVID-19. Institutional protocols and algorithms that pertain to the evaluation of patients at risk for COVID-19 are in a state of rapid change based on information released by regulatory bodies including the CDC and federal and state organizations. These policies and algorithms were followed during the patient's care in  the ED.  As part of my medical decision making, I reviewed the following data within the electronic MEDICAL RECORD NUMBER Nursing notes reviewed and incorporated, Labs reviewed, notes from prior ED visits and  Controlled Substance Database   ____________________________________________   FINAL CLINICAL IMPRESSION(S) / ED DIAGNOSES  Final diagnoses:  Acute deep vein thrombosis (DVT) of femoral vein of left lower extremity (HCC)      NEW MEDICATIONS STARTED DURING THIS VISIT:  New Prescriptions   APIXABAN (ELIQUIS) VTE STARTER PACK (10MG  AND 5MG )    Take as directed on package: start with two-5mg  tablets twice daily for 7 days. On day 8, switch to one-5mg  tablet twice daily.     Note:  This document was prepared using Dragon voice recognition software and may include unintentional dictation errors.    , MD 10/20/20 2003

## 2020-10-20 NOTE — ED Triage Notes (Addendum)
Pt to triage via wheelchair.  Pt fell 2 weeks ago and fx left tibia.  Pt was seen today at [facility], dx with blood clot to left leg.  Pt sent to er for eval. Pt alert  Speech clear.  Pt has knee immoblizer on left knee.  Pt had an mri today at [facility]

## 2020-10-21 ENCOUNTER — Encounter: Payer: Managed Care, Other (non HMO) | Admitting: Family Medicine

## 2020-10-26 ENCOUNTER — Encounter: Payer: Self-pay | Admitting: Family Medicine

## 2020-10-26 ENCOUNTER — Other Ambulatory Visit: Payer: Self-pay

## 2020-10-26 ENCOUNTER — Encounter (INDEPENDENT_AMBULATORY_CARE_PROVIDER_SITE_OTHER): Payer: Self-pay | Admitting: Vascular Surgery

## 2020-10-26 ENCOUNTER — Ambulatory Visit (INDEPENDENT_AMBULATORY_CARE_PROVIDER_SITE_OTHER): Payer: Managed Care, Other (non HMO) | Admitting: Vascular Surgery

## 2020-10-26 VITALS — BP 141/90 | HR 92 | Ht 68.0 in | Wt 202.0 lb

## 2020-10-26 DIAGNOSIS — E782 Mixed hyperlipidemia: Secondary | ICD-10-CM | POA: Diagnosis not present

## 2020-10-26 DIAGNOSIS — S82102S Unspecified fracture of upper end of left tibia, sequela: Secondary | ICD-10-CM | POA: Diagnosis not present

## 2020-10-26 DIAGNOSIS — I10 Essential (primary) hypertension: Secondary | ICD-10-CM | POA: Diagnosis not present

## 2020-10-26 DIAGNOSIS — I82412 Acute embolism and thrombosis of left femoral vein: Secondary | ICD-10-CM | POA: Diagnosis not present

## 2020-10-27 ENCOUNTER — Other Ambulatory Visit: Payer: Self-pay | Admitting: Family Medicine

## 2020-10-27 ENCOUNTER — Encounter (INDEPENDENT_AMBULATORY_CARE_PROVIDER_SITE_OTHER): Payer: Self-pay | Admitting: Vascular Surgery

## 2020-10-27 DIAGNOSIS — I10 Essential (primary) hypertension: Secondary | ICD-10-CM

## 2020-10-27 DIAGNOSIS — I82412 Acute embolism and thrombosis of left femoral vein: Secondary | ICD-10-CM | POA: Insufficient documentation

## 2020-10-27 DIAGNOSIS — S82109A Unspecified fracture of upper end of unspecified tibia, initial encounter for closed fracture: Secondary | ICD-10-CM | POA: Insufficient documentation

## 2020-10-27 MED ORDER — APIXABAN 5 MG PO TABS: 5.0000 mg | ORAL_TABLET | Freq: Two times a day (BID) | ORAL | 5 refills | Status: DC

## 2020-10-27 NOTE — Progress Notes (Signed)
MRN : [id number]  Donna Liu is a 55 y.o. (01/31/1966) female who presents with chief complaint of  Chief Complaint  Patient presents with  . Follow-up    Acute Femoral vein DVT  .  History of Present Illness:   The patient presents to the office for evaluation of DVT.  DVT was identified at [facility] by Duplex ultrasound.  The initial symptoms were pain and swelling in the left lower extremity after she fell sustaining a fracture of the proximal tibia.  The patient notes the leg continues to be very painful with dependency and swells quite a bite.  Symptoms are much better with elevation.  The patient notes minimal edema in the morning which steadily worsens throughout the day.    The patient has not been using compression therapy at this point.  No SOB or pleuritic chest pains.  No cough or hemoptysis.  No blood per rectum or blood in any sputum.  No excessive bruising per the patient.   Current Meds  Medication Sig  . APIXABAN (ELIQUIS) VTE STARTER PACK (10MG  AND 5MG ) Take as directed on package: start with two-5mg  tablets twice daily for 7 days. On day 8, switch to one-5mg  tablet twice daily.  . DULoxetine (CYMBALTA) 20 MG capsule Take 20 mg by mouth 2 (two) times daily.  lisinopril (ZESTRIL) 40 MG tablet Take 1 tablet daily  . pregabalin (LYRICA) 50 MG capsule pregabalin 50 mg capsule  TAKE 1 CAPSULE IN THE MORNING AND 2 CAPSULES IN THE EVENING, OK TO TAKE EXTRA 50 MG DURING FLARES  . sertraline (ZOLOFT) 100 MG tablet Take 150 mg by mouth daily.  . traMADol (ULTRAM) 50 MG tablet Take by mouth every 4 (four) hours as needed.  . traZODone (DESYREL) 50 MG tablet trazodone 50 mg tablet  START WITH 1 TABLET BY MOUTH NIGHTLY AND CAN INCREASE TO 2 TABLETS NIGHTLY AS NEEDED  . [DISCONTINUED] hydrochlorothiazide (HYDRODIURIL) 12.5 MG tablet Take 1 tablet (12.5 mg total) by mouth daily.    Past Medical History:  Diagnosis Date  . Acid reflux   . Adrenal nodule (HCC)   .  Allergy   . Anxiety   . CRPS (complex regional pain syndrome type I)   . Depression   . Fibromyalgia   . Hypertension   . PTSD (post-traumatic stress disorder)     Past Surgical History:  Procedure Laterality Date  . ABDOMINAL HYSTERECTOMY    . APPENDECTOMY    . CHOLECYSTECTOMY    . FOOT SURGERY Right   . GASTRIC BYPASS    . TONSILLECTOMY      Social History Social History   Tobacco Use  . Smoking status: Current Every Day Smoker    Packs/day: 0.50    Years: 35.00    Pack years: 17.50    Types: Cigarettes  . Smokeless tobacco: Never Used  Vaping Use  . Vaping Use: Never used  Substance Use Topics  . Alcohol use: Not Currently    Comment: occasional   . Drug use: Never    Family History Family History  Problem Relation Age of Onset  . Hypertension Mother   . Hyperlipidemia Mother   . Atrial fibrillation Mother   . Healthy Father   No family history of bleeding/clotting disorders, porphyria or autoimmune disease   Allergies  Allergen Reactions  . Penicillins Anaphylaxis    Has patient had a PCN reaction causing immediate rash, facial/tongue/throat swelling, SOB or lightheadedness with hypotension: Yes Has patient had  a PCN reaction causing severe rash involving mucus membranes or skin necrosis: No Has patient had a PCN reaction that required hospitalization Yes Has patient had a PCN reaction occurring within the last 10 years: No If all of the above answers are "NO", then may proceed with Cephalosporin use.   . Tapentadol Anaphylaxis  . Sulfur Hives and Rash  . Oxycodone-Acetaminophen Hives    Pt says she is not allergic   . Sulfa Antibiotics Hives and Rash     REVIEW OF SYSTEMS (Negative unless checked)  Constitutional: [] Weight loss  [] Fever  [] Chills Cardiac: [] Chest pain   [] Chest pressure   [] Palpitations   [] Shortness of breath when laying flat   [] Shortness of breath with exertion. Vascular:  [] Pain in legs with walking   [x] Pain in legs at  rest  [x] History of DVT   [] Phlebitis   [x] Swelling in legs   [] Varicose veins   [] Non-healing ulcers Pulmonary:   [] Uses home oxygen   [] Productive cough   [] Hemoptysis   [] Wheeze  [] COPD   [] Asthma Neurologic:  [] Dizziness   [] Seizures   [] History of stroke   [] History of TIA  [] Aphasia   [] Vissual changes   [] Weakness or numbness in arm   [] Weakness or numbness in leg Musculoskeletal:   [x] Joint swelling   [x] Joint pain   [] Low back pain Hematologic:  [] Easy bruising  [] Easy bleeding   [] Hypercoagulable state   [] Anemic Gastrointestinal:  [] Diarrhea   [] Vomiting  [] Gastroesophageal reflux/heartburn   [] Difficulty swallowing. Genitourinary:  [] Chronic kidney disease   [] Difficult urination  [] Frequent urination   [] Blood in urine Skin:  [] Rashes   [] Ulcers  Psychological:  [] History of anxiety   []  History of major depression.  Physical Examination  Vitals:   10/26/20 1611  BP: (!) 141/90  Pulse: 92  Weight: 202 lb (91.6 kg)  Height: 5\' 8"  (1.727 m)   Body mass index is 30.71 kg/m. Gen: WD/WN, NAD Head: [place]/AT, No temporalis wasting.  Ear/Nose/Throat: Hearing grossly intact, nares w/o erythema or drainage, poor dentition Eyes: PER, EOMI, sclera nonicteric.  Neck: Supple, no masses.  No bruit or JVD.  Pulmonary:  Good air movement, clear to auscultation bilaterally, no use of accessory muscles.  Cardiac: RRR, normal S1, S2, no Murmurs. Vascular: scattered varicosities present bilaterally.  Mild venous stasis changes to the legs bilaterally.  3+ soft pitting edema Vessel Right Left  Radial Palpable Palpable  Gastrointestinal: soft, non-distended. No guarding/no peritoneal signs.  Musculoskeletal: M/S 5/5 throughout.  No deformity or atrophy.  Neurologic: CN 2-12 intact. Pain and light touch intact in extremities.  Symmetrical.  Speech is fluent. Motor exam as listed above. Psychiatric: Judgment intact, Mood & affect appropriate for pt's clinical situation. Dermatologic: Mild venous  rashes no ulcers noted.  No changes consistent with cellulitis.  CBC Lab Results  Component Value Date   WBC 9.9 10/20/2020   HGB 13.7 10/20/2020   HCT 41.1 10/20/2020   MCV 93.4 10/20/2020   PLT 270 10/20/2020    BMET    Component Value Date/Time   NA 138 10/20/2020 1706   K 4.6 10/20/2020 1706   CL 102 10/20/2020 1706   CO2 27 10/20/2020 1706   GLUCOSE 145 (H) 10/20/2020 1706   BUN 13 10/20/2020 1706   CREATININE 0.63 10/20/2020 1706   CALCIUM 9.4 10/20/2020 1706   GFRNONAA >60 10/20/2020 1706   GFRAA >60 08/08/2015 1134   Estimated Creatinine Clearance: 94.1 mL/min (by C-G formula based on SCr of 0.63 mg/dL).  COAG No results found for: INR, PROTIME  Radiology US Venous Img Lower Unilateral Left (DVT)  Result Date: 10/20/2020 CLINICAL DATA:  Initial evaluation for acute left leg pain and swelling. EXAM: LEFT LOWER EXTREMITY VENOUS DOPPLER ULTRASOUND TECHNIQUE: Gray-scale sonography with graded compression, as well as color Doppler and duplex ultrasound were performed to evaluate the lower extremity deep venous systems from the level of the common femoral vein and including the common femoral, femoral, profunda femoral, popliteal and calf veins including the posterior tibial, peroneal and gastrocnemius veins when visible. The superficial great saphenous vein was also interrogated. Spectral Doppler was utilized to evaluate flow at rest and with distal augmentation maneuvers in the common femoral, femoral and popliteal veins. COMPARISON:  None. FINDINGS: Contralateral Common Femoral Vein: Respiratory phasicity is normal and symmetric with the symptomatic side. No evidence of thrombus. Normal compressibility. Common Femoral Vein: No evidence of thrombus. Normal compressibility, respiratory phasicity and response to augmentation. Saphenofemoral Junction: No evidence of thrombus. Normal compressibility and flow on color Doppler imaging. Profunda Femoral Vein: No evidence of thrombus.  Normal compressibility and flow on color Doppler imaging. Femoral Vein: Echogenic material seen within the mid-distal left femoral vein, consistent with acute thrombus. This is near occlusive in nature. Loss of normal compressibility. Popliteal Vein: Echogenic material partially fills the left popliteal vein, consistent with nonocclusive thrombus. Loss of normal compressibility. Calf Veins: No evidence of thrombus. Normal compressibility and flow on color Doppler imaging. Superficial Great Saphenous Vein: No evidence of thrombus. Normal compressibility. Venous Reflux:  None. Other Findings:  None. IMPRESSION: Positive study for acute DVT with partially occlusive thrombus extending from the mid-distal left femoral vein through the left popliteal vein. Electronically Signed   By: Rise Mu M.D.   On: 10/20/2020 19:44      Assessment/Plan 1. DVT femoral (deep venous thrombosis) with thrombophlebitis, left (HCC) Recommend:   No surgery or intervention at this point in time.  IVC filter is not indicated at present.  Given the thrombus is in the distal femoral and popliteal and the lack of postphlebitic changes in the ankle and foot area I do not recommend thrombectomy at this time  Patient's duplex ultrasound of the venous system shows DVT from the popliteal to the distal femoral veins.  The patient is initiated on anticoagulation   Elevation was stressed, use of a recliner was discussed.  I have had discussion with the patient regarding DVT and post phlebitic changes such as swelling and why it  causes symptoms such as pain.  The patient will wear graduated compression stockings class 1 (20-30 mmHg), as tolerated given her fracture, on a daily basis a prescription was given. The patient will  beginning wearing the stockings first thing in the morning and removing them in the evening. The patient is instructed specifically not to sleep in the stockings.  In addition, behavioral modification  including elevation during the day and avoidance of prolonged dependency will be initiated.    The patient will continue anticoagulation for now as there have not been any problems or complications at this point.   - VAS [place] LOWER EXTREMITY VENOUS (DVT); Future  2. Closed fracture of proximal end of left tibia, unspecified fracture morphology, sequela Plan per Ortho  3. Mixed hyperlipidemia Continue statin as ordered and reviewed, no changes at this time   4. Primary hypertension Continue antihypertensive medications as already ordered, these medications have been reviewed and there are no changes at this time.     Levora Dredge,  MD  10/27/2020 4:00 PM

## 2020-10-27 NOTE — Telephone Encounter (Signed)
   Notes to clinic: looks like patient sent a message regarding some change in BP medications Review for refill   Requested Prescriptions  Pending Prescriptions Disp Refills   hydrochlorothiazide (HYDRODIURIL) 12.5 MG tablet [Pharmacy Med Name: HYDROCHLOROTHIAZIDE 12.5 MG TB] 30 tablet 0    Sig: TAKE 1 TABLET BY MOUTH EVERY DAY      Cardiovascular: Diuretics - Thiazide Failed - 10/27/2020  8:31 AM      Failed - Last BP in normal range    BP Readings from Last 1 Encounters:  10/26/20 (!) 141/90          Passed - Ca in normal range and within 360 days    Calcium  Date Value Ref Range Status  10/20/2020 9.4 8.9 - 10.3 mg/dL Final          Passed - Cr in normal range and within 360 days    Creatinine, Ser  Date Value Ref Range Status  10/20/2020 0.63 0.44 - 1.00 mg/dL Final          Passed - K in normal range and within 360 days    Potassium  Date Value Ref Range Status  10/20/2020 4.6 3.5 - 5.1 mmol/L Final          Passed - Na in normal range and within 360 days    Sodium  Date Value Ref Range Status  10/20/2020 138 135 - 145 mmol/L Final          Passed - Valid encounter within last 6 months    Recent Outpatient Visits           3 weeks ago Hypertension, unspecified type   [facility], Jodelle Gross, FNP   1 month ago Hypertension, unspecified type   [facility], Jodelle Gross, [place]

## 2020-10-28 ENCOUNTER — Other Ambulatory Visit (INDEPENDENT_AMBULATORY_CARE_PROVIDER_SITE_OTHER): Payer: Self-pay | Admitting: Nurse Practitioner

## 2020-10-29 ENCOUNTER — Telehealth (INDEPENDENT_AMBULATORY_CARE_PROVIDER_SITE_OTHER): Payer: Managed Care, Other (non HMO) | Admitting: Family Medicine

## 2020-10-29 ENCOUNTER — Other Ambulatory Visit: Payer: Self-pay

## 2020-10-29 ENCOUNTER — Encounter: Payer: Self-pay | Admitting: Family Medicine

## 2020-10-29 VITALS — BP 148/92 | HR 82

## 2020-10-29 DIAGNOSIS — I10 Essential (primary) hypertension: Secondary | ICD-10-CM

## 2020-10-29 MED ORDER — AMLODIPINE BESYLATE 10 MG PO TABS: 10.0000 mg | ORAL_TABLET | Freq: Every day | ORAL | 0 refills | Status: DC

## 2020-10-29 NOTE — Progress Notes (Signed)
Virtual Visit via Telephone  The purpose of this virtual visit is to provide medical care while limiting exposure to the novel coronavirus (COVID19) for both patient and office staff.  Consent was obtained for phone visit:  Yes.   Answered questions that patient had about telehealth interaction:  Yes.   I discussed the limitations, risks, security and privacy concerns of performing an evaluation and management service by telephone. I also discussed with the patient that there may be a patient responsible charge related to this service. The patient expressed understanding and agreed to proceed.  Patient is at home and is accessed via telephone Services are provided by Charlaine Dalton, FNP-C from [facility])  ---------------------------------------------------------------------- Chief Complaint  Patient presents with  . Hypertension    S: Reviewed CMA documentation. I have called patient and gathered additional HPI as follows:  Donna Liu presents to clinic for hypertension medication change.  Requesting to change off of hydrochlorothiazide since she has a tibial fracture and difficulty with multiple trips to the restroom due to increased urination.    Patient is currently home Denies any high risk travel to areas of current concern for COVID19. Denies any known or suspected exposure to person with or possibly with COVID19.  Past Medical History:  Diagnosis Date  . Acid reflux   . Adrenal nodule (HCC)   . Allergy   . Anxiety   . CRPS (complex regional pain syndrome type I)   . Depression   . Fibromyalgia   . Hypertension   . PTSD (post-traumatic stress disorder)    Social History   Tobacco Use  . Smoking status: Current Every Day Smoker    Packs/day: 0.75    Years: 35.00    Pack years: 26.25    Types: Cigarettes  . Smokeless tobacco: Never Used  Vaping Use  . Vaping Use: Never used  Substance Use Topics  . Alcohol use: Not Currently    Comment:  occasional   . Drug use: Never    Current Outpatient Medications:  .  amLODipine (NORVASC) 10 MG tablet, Take 1 tablet (10 mg total) by mouth daily., Disp: 30 tablet, Rfl: 0 .  apixaban (ELIQUIS) 5 MG TABS tablet, Take 1 tablet (5 mg total) by mouth 2 (two) times daily., Disp: 60 tablet, Rfl: 5 .  DULoxetine (CYMBALTA) 20 MG capsule, Take 20 mg by mouth 2 (two) times daily., Disp: , Rfl:  .  lisinopril (ZESTRIL) 40 MG tablet, Take 1 tablet daily, Disp: 90 tablet, Rfl: 1 .  pregabalin (LYRICA) 50 MG capsule, pregabalin 50 mg capsule  TAKE 1 CAPSULE IN THE MORNING AND 2 CAPSULES IN THE EVENING, OK TO TAKE EXTRA 50 MG DURING FLARES, Disp: , Rfl:  .  sertraline (ZOLOFT) 100 MG tablet, Take 150 mg by mouth daily., Disp: , Rfl:  .  traMADol (ULTRAM) 50 MG tablet, Take by mouth every 4 (four) hours as needed., Disp: , Rfl:  .  traZODone (DESYREL) 50 MG tablet, trazodone 50 mg tablet  START WITH 1 TABLET BY MOUTH NIGHTLY AND CAN INCREASE TO 2 TABLETS NIGHTLY AS NEEDED, Disp: , Rfl:   Depression screen [facility] 2/9 09/21/2020  Decreased Interest 0  Down, Depressed, Hopeless 1  PHQ - 2 Score 1    No flowsheet data found.  -------------------------------------------------------------------------- O: No physical exam performed due to remote telephone encounter.  Physical Exam: Patient remotely monitored without video.  Verbal communication appropriate.  Cognition normal.  Recent Results (from the past 2160  hour(s))  Basic metabolic panel     Status: Abnormal   Collection Time: 10/20/20  5:06 PM  Result Value Ref Range   Sodium 138 135 - 145 mmol/L   Potassium 4.6 3.5 - 5.1 mmol/L   Chloride 102 98 - 111 mmol/L   CO2 27 22 - 32 mmol/L   Glucose, Bld 145 (H) 70 - 99 mg/dL    Comment: Glucose reference range applies only to samples taken after fasting for at least 8 hours.   BUN 13 6 - 20 mg/dL   Creatinine, Ser 4.74 0.44 - 1.00 mg/dL   Calcium 9.4 8.9 - 25.9 mg/dL   GFR, Estimated >56 >38 mL/min     Comment: (NOTE) Calculated using the CKD-EPI Creatinine Equation (2021)    Anion gap 9 5 - 15    Comment: Performed at [facility], [address] [place], [place] [address]  CBC     Status: None   Collection Time: 10/20/20  5:06 PM  Result Value Ref Range   WBC 9.9 4.0 - 10.5 K/uL   RBC 4.40 3.87 - 5.11 MIL/uL   Hemoglobin 13.7 12.0 - 15.0 g/dL   HCT 32.9 51.8 - 84.1 %   MCV 93.4 80.0 - 100.0 fL   MCH 31.1 26.0 - 34.0 pg   MCHC 33.3 30.0 - 36.0 g/dL   RDW 66.0 63.0 - 16.0 %   Platelets 270 150 - 400 K/uL   nRBC 0.0 0.0 - 0.2 %    Comment: Performed at [facility], [address], [place], [place] [address]    -------------------------------------------------------------------------- A&P:  Problem List Items Addressed This Visit      Cardiovascular and Mediastinum   Hypertension - Primary    Will switch from hydrochlorothiazide 12.5mg  to amlodipine 10mg  in addition to lisinopril 40mg  daily for BP control.  To RTC in 2 weeks for re-evaluation of BP and control with medication regimen.      Relevant Medications   amLODipine (NORVASC) 10 MG tablet      Meds ordered this encounter  Medications  . amLODipine (NORVASC) 10 MG tablet    Sig: Take 1 tablet (10 mg total) by mouth daily.    Dispense:  30 tablet    Refill:  0    Follow-up: - Return in 2 weeks for BP re-evaluation  Patient verbalizes understanding with the above medical recommendations including the limitation of remote medical advice.  Specific follow-up and call-back criteria were given for patient to follow-up or seek medical care more urgently if needed.  - Time spent in direct consultation with patient on phone: 5 minutes  , FNP-C [facility] 10/29/2020, 2:38 PM

## 2020-10-29 NOTE — Assessment & Plan Note (Signed)
Will switch from hydrochlorothiazide 12.5mg  to amlodipine 10mg  in addition to lisinopril 40mg  daily for BP control.  To RTC in 2 weeks for re-evaluation of BP and control with medication regimen.

## 2020-11-11 ENCOUNTER — Ambulatory Visit (INDEPENDENT_AMBULATORY_CARE_PROVIDER_SITE_OTHER): Payer: Managed Care, Other (non HMO) | Admitting: Family Medicine

## 2020-11-11 ENCOUNTER — Other Ambulatory Visit: Payer: Self-pay

## 2020-11-11 ENCOUNTER — Encounter: Payer: Self-pay | Admitting: Family Medicine

## 2020-11-11 DIAGNOSIS — I10 Essential (primary) hypertension: Secondary | ICD-10-CM

## 2020-11-11 MED ORDER — LISINOPRIL 40 MG PO TABS: ORAL_TABLET | ORAL | 1 refills | Status: DC

## 2020-11-11 MED ORDER — AMLODIPINE BESYLATE 10 MG PO TABS: 10.0000 mg | ORAL_TABLET | Freq: Every day | ORAL | 1 refills | Status: DC

## 2020-11-11 NOTE — Patient Instructions (Addendum)
Continue all of your medications as prescribed  Try to get exercise a minimum of 30 minutes per day at least 5 days per week as well as  adequate water intake all while measuring blood pressure a few times per week.  Keep a blood pressure log and bring back to clinic at your next visit.  If your readings are consistently over 130/80 to contact our office/send me a MyChart message and we will see you sooner.  Can try DASH and Mediterranean diet options, avoiding processed foods, lowering sodium intake, avoiding pork products, and eating a plant based diet for optimal health.  We will plan to see you back in 6 months for hypertension follow up  You will receive a survey after today's visit either digitally by e-mail or paper by USPS mail. Your experiences and feedback matter to [place].  Please respond so we know how we are doing as we provide care for you.  Call us with any questions/concerns/needs.  It is my goal to be available to you for your health concerns.  Thanks for choosing me to be a partner in your healthcare needs!  Charlaine Dalton, FNP-C Family Nurse Practitioner [facility] Phone: [phone]

## 2020-11-11 NOTE — Progress Notes (Signed)
Subjective:    Patient ID: Donna Liu, female    DOB: May 22, 1966, 55 y.o.   MRN: [id number]  Donna Liu is a 55 y.o. female presenting on 11/11/2020 for Hypertension   HPI  Ms. Caples presents to clinic for follow up on her hypertension.  Hypertension - She is not checking BP at home or outside of clinic.    - Current medications: lisinopril 40mg  and amlodipine 10mg  daily, tolerating well without side effects - She is not currently symptomatic. - Pt denies headache, lightheadedness, dizziness, changes in vision, chest tightness/pressure, palpitations, leg swelling, sudden loss of speech or loss of consciousness. - She  reports no regular exercise routine. - Her diet is high in salt, high in fat, and high in carbohydrates.  Depression screen PHQ 2/9 09/21/2020  Decreased Interest 0  Down, Depressed, Hopeless 1  PHQ - 2 Score 1    Social History   Tobacco Use  . Smoking status: Current Every Day Smoker    Packs/day: 0.75    Years: 35.00    Pack years: 26.25    Types: Cigarettes  . Smokeless tobacco: Never Used  Vaping Use  . Vaping Use: Never used  Substance Use Topics  . Alcohol use: Not Currently    Comment: occasional   . Drug use: Never    Review of Systems  Constitutional: Negative.   HENT: Negative.   Eyes: Negative.   Respiratory: Negative.   Cardiovascular: Negative.   Gastrointestinal: Negative.   Endocrine: Negative.   Genitourinary: Negative.   Musculoskeletal: Negative.   Skin: Negative.   Allergic/Immunologic: Negative.   Neurological: Negative.   Hematological: Negative.   Psychiatric/Behavioral: Negative.    Per HPI unless specifically indicated above     Objective:    BP 120/83 (BP Location: Right Arm, Patient Position: Sitting, Cuff Size: Normal)   Pulse 72   Temp 97.7 F (36.5 C) (Temporal)   Resp 18   Ht 5\' 8"  (1.727 m)   Wt 204 lb (92.5 kg)   SpO2 100%   BMI 31.02 kg/m   Wt Readings from Last 3 Encounters:   11/11/20 204 lb (92.5 kg)  10/26/20 202 lb (91.6 kg)  10/20/20 200 lb (90.7 kg)    Physical Exam Vitals and nursing note reviewed.  Constitutional:      General: She is not in acute distress.    Appearance: Normal appearance. She is well-developed and well-groomed. She is not ill-appearing or toxic-appearing.  HENT:     Head: Normocephalic and atraumatic.     Nose:     Comments: 11/13/20 is in place, covering mouth and nose. Eyes:     General: Lids are normal. Vision grossly intact.        Right eye: No discharge.        Left eye: No discharge.     Extraocular Movements: Extraocular movements intact.     Conjunctiva/sclera: Conjunctivae normal.     Pupils: Pupils are equal, round, and reactive to light.  Pulmonary:     Effort: Pulmonary effort is normal. No respiratory distress.  Skin:    General: Skin is warm and dry.     Capillary Refill: Capillary refill takes less than 2 seconds.  Neurological:     General: No focal deficit present.     Mental Status: She is alert and oriented to person, place, and time.  Psychiatric:        Attention and Perception: Attention and perception normal.  Mood and Affect: Mood and affect normal.        Speech: Speech normal.        Behavior: Behavior normal. Behavior is cooperative.        Thought Content: Thought content normal.        Cognition and Memory: Cognition and memory normal.        Judgment: Judgment normal.    Results for orders placed or performed during the hospital encounter of 10/20/20  Basic metabolic panel  Result Value Ref Range   Sodium 138 135 - 145 mmol/L   Potassium 4.6 3.5 - 5.1 mmol/L   Chloride 102 98 - 111 mmol/L   CO2 27 22 - 32 mmol/L   Glucose, Bld 145 (H) 70 - 99 mg/dL   BUN 13 6 - 20 mg/dL   Creatinine, Ser 6.20 0.44 - 1.00 mg/dL   Calcium 9.4 8.9 - 35.5 mg/dL   GFR, Estimated >97 >41 mL/min   Anion gap 9 5 - 15  CBC  Result Value Ref Range   WBC 9.9 4.0 - 10.5 K/uL   RBC 4.40 3.87 - 5.11  MIL/uL   Hemoglobin 13.7 12.0 - 15.0 g/dL   HCT 63.8 45.3 - 64.6 %   MCV 93.4 80.0 - 100.0 fL   MCH 31.1 26.0 - 34.0 pg   MCHC 33.3 30.0 - 36.0 g/dL   RDW 80.3 21.2 - 24.8 %   Platelets 270 150 - 400 K/uL   nRBC 0.0 0.0 - 0.2 %      Assessment & Plan:   Problem List Items Addressed This Visit      Cardiovascular and Mediastinum   Hypertension    Controlled hypertension.  BP is at goal < 130/80.  Pt reports working on lifestyle modifications.  Taking medications tolerating well without side effects.   Plan: 1. Continue taking amlodipine 10mg  and lisinopril 40mg  daily 2. Obtain labs at next visit  3. Encouraged heart healthy diet and increasing exercise to 30 minutes most days of the week, going no more than 2 days in a row without exercise. 4. Check BP 1-2 x per week at home, keep log, and bring to clinic at next appointment. 5. Follow up 6 months.       Relevant Medications   amLODipine (NORVASC) 10 MG tablet   lisinopril (ZESTRIL) 40 MG tablet      Meds ordered this encounter  Medications  . amLODipine (NORVASC) 10 MG tablet    Sig: Take 1 tablet (10 mg total) by mouth daily.    Dispense:  90 tablet    Refill:  1  . lisinopril (ZESTRIL) 40 MG tablet    Sig: Take 1 tablet daily    Dispense:  90 tablet    Refill:  1   Follow up plan: Return in about 6 months (around 05/11/2021) for HTN F/U.   , FNP Family Nurse Practitioner [facility] 11/11/2020, 10:01 AM

## 2020-11-11 NOTE — Assessment & Plan Note (Signed)
Controlled hypertension.  BP is at goal < 130/80.  Pt reports working on lifestyle modifications.  Taking medications tolerating well without side effects.   Plan: 1. Continue taking amlodipine 10mg  and lisinopril 40mg  daily 2. Obtain labs at next visit  3. Encouraged heart healthy diet and increasing exercise to 30 minutes most days of the week, going no more than 2 days in a row without exercise. 4. Check BP 1-2 x per week at home, keep log, and bring to clinic at next appointment. 5. Follow up 6 months.

## 2020-11-24 ENCOUNTER — Ambulatory Visit: Payer: Managed Care, Other (non HMO) | Admitting: Physical Therapy

## 2020-11-26 ENCOUNTER — Encounter (INDEPENDENT_AMBULATORY_CARE_PROVIDER_SITE_OTHER): Payer: Self-pay | Admitting: Vascular Surgery

## 2020-11-26 ENCOUNTER — Other Ambulatory Visit: Payer: Self-pay

## 2020-11-26 ENCOUNTER — Ambulatory Visit (INDEPENDENT_AMBULATORY_CARE_PROVIDER_SITE_OTHER): Payer: Managed Care, Other (non HMO) | Admitting: Vascular Surgery

## 2020-11-26 ENCOUNTER — Ambulatory Visit (INDEPENDENT_AMBULATORY_CARE_PROVIDER_SITE_OTHER): Payer: Managed Care, Other (non HMO)

## 2020-11-26 VITALS — BP 129/85 | HR 98 | Resp 16 | Wt 204.6 lb

## 2020-11-26 DIAGNOSIS — I82412 Acute embolism and thrombosis of left femoral vein: Secondary | ICD-10-CM

## 2020-11-26 DIAGNOSIS — I10 Essential (primary) hypertension: Secondary | ICD-10-CM | POA: Diagnosis not present

## 2020-11-26 DIAGNOSIS — E782 Mixed hyperlipidemia: Secondary | ICD-10-CM

## 2020-11-26 NOTE — Progress Notes (Signed)
MRN : [id number]  Donna Liu is a 55 y.o. (10-May-1966) female who presents with chief complaint of No chief complaint on file. Marland Kitchen  History of Present Illness:    The patient presents to the office for evaluation of DVT.  DVT was identified at [facility] by Duplex ultrasound.  The initial symptoms were pain and swelling in the lower extremity.  The patient notes the leg continues to be very painful with dependency and swells quite a bite.  Symptoms are much better with elevation.  The patient notes minimal edema in the morning which steadily worsens throughout the day.    The patient has not been using compression therapy at this point.  No SOB or pleuritic chest pains.  No cough or hemoptysis.  No blood per rectum or blood in any sputum.  No excessive bruising per the patient.    No outpatient medications have been marked as taking for the 11/26/20 encounter (Appointment) with Gilda Crease, Latina Craver, MD.    Past Medical History:  Diagnosis Date  . Acid reflux   . Adrenal nodule (HCC)   . Allergy   . Anxiety   . CRPS (complex regional pain syndrome type I)   . Depression   . Fibromyalgia   . Hypertension   . PTSD (post-traumatic stress disorder)     Past Surgical History:  Procedure Laterality Date  . ABDOMINAL HYSTERECTOMY    . APPENDECTOMY    . CHOLECYSTECTOMY    . FOOT SURGERY Right   . GASTRIC BYPASS    . TONSILLECTOMY      Social History Social History   Tobacco Use  . Smoking status: Current Every Day Smoker    Packs/day: 0.75    Years: 35.00    Pack years: 26.25    Types: Cigarettes  . Smokeless tobacco: Never Used  Vaping Use  . Vaping Use: Never used  Substance Use Topics  . Alcohol use: Not Currently    Comment: occasional   . Drug use: Never    Family History Family History  Problem Relation Age of Onset  . Hypertension Mother   . Hyperlipidemia Mother   . Atrial fibrillation Mother   . Healthy Father     Allergies  Allergen Reactions   . Penicillins Anaphylaxis    Has patient had a PCN reaction causing immediate rash, facial/tongue/throat swelling, SOB or lightheadedness with hypotension: Yes Has patient had a PCN reaction causing severe rash involving mucus membranes or skin necrosis: No Has patient had a PCN reaction that required hospitalization Yes Has patient had a PCN reaction occurring within the last 10 years: No If all of the above answers are "NO", then may proceed with Cephalosporin use.   . Tapentadol Anaphylaxis  . Elemental Sulfur Hives and Rash  . Sulfa Antibiotics Hives and Rash     REVIEW OF SYSTEMS (Negative unless checked)  Constitutional: [] Weight loss  [] Fever  [] Chills Cardiac: [] Chest pain   [] Chest pressure   [] Palpitations   [] Shortness of breath when laying flat   [] Shortness of breath with exertion. Vascular:  [] Pain in legs with walking   [x] Pain in legs at rest  [x] History of DVT   [] Phlebitis   [x] Swelling in legs   [] Varicose veins   [] Non-healing ulcers Pulmonary:   [] Uses home oxygen   [] Productive cough   [] Hemoptysis   [] Wheeze  [] COPD   [] Asthma Neurologic:  [] Dizziness   [] Seizures   [] History of stroke   [] History of TIA  [] Aphasia   []   Vissual changes   [] Weakness or numbness in arm   [] Weakness or numbness in leg Musculoskeletal:   [] Joint swelling   [x] Joint pain   [] Low back pain Hematologic:  [] Easy bruising  [] Easy bleeding   [] Hypercoagulable state   [] Anemic Gastrointestinal:  [] Diarrhea   [] Vomiting  [] Gastroesophageal reflux/heartburn   [] Difficulty swallowing. Genitourinary:  [] Chronic kidney disease   [] Difficult urination  [] Frequent urination   [] Blood in urine Skin:  [] Rashes   [] Ulcers  Psychological:  [] History of anxiety   []  History of major depression.  Physical Examination  There were no vitals filed for this visit. There is no height or weight on file to calculate BMI. Gen: WD/WN, NAD Head: [place]/AT, No temporalis wasting.  Ear/Nose/Throat: Hearing grossly  intact, nares w/o erythema or drainage Eyes: PER, EOMI, sclera nonicteric.  Neck: Supple, no large masses.   Pulmonary:  Good air movement, no audible wheezing bilaterally, no use of accessory muscles.  Cardiac: RRR, no JVD Vascular: scattered varicosities present bilaterally.  Mild venous stasis changes to the legs bilaterally.  2+ soft pitting edema Vessel Right Left  Radial Palpable Palpable  PT Palpable Palpable  DP Palpable Palpable  Gastrointestinal: Non-distended. No guarding/no peritoneal signs.  Musculoskeletal: M/S 5/5 throughout.  No deformity or atrophy.  Neurologic: CN 2-12 intact. Symmetrical.  Speech is fluent. Motor exam as listed above. Psychiatric: Judgment intact, Mood & affect appropriate for pt's clinical situation. Dermatologic: No rashes or ulcers noted.  No changes consistent with cellulitis. Lymph : No lichenification or skin changes of chronic lymphedema.  CBC Lab Results  Component Value Date   WBC 9.9 10/20/2020   HGB 13.7 10/20/2020   HCT 41.1 10/20/2020   MCV 93.4 10/20/2020   PLT 270 10/20/2020    BMET    Component Value Date/Time   NA 138 10/20/2020 1706   K 4.6 10/20/2020 1706   CL 102 10/20/2020 1706   CO2 27 10/20/2020 1706   GLUCOSE 145 (H) 10/20/2020 1706   BUN 13 10/20/2020 1706   CREATININE 0.63 10/20/2020 1706   CALCIUM 9.4 10/20/2020 1706   GFRNONAA >60 10/20/2020 1706   GFRAA >60 08/08/2015 1134   CrCl cannot be calculated (Patient's most recent lab result is older than the maximum 21 days allowed.).  COAG No results found for: INR, PROTIME  Radiology No results found.   Assessment/Plan 1. DVT femoral (deep venous thrombosis) with thrombophlebitis, left (HCC) Recommend:   No surgery or intervention at this point in time.  IVC filter is not indicated at present.  Given the thrombus is in the distal femoral and popliteal and the lack of postphlebitic changes in the ankle and foot area I do not recommend thrombectomy at  this time  Patient's duplex ultrasound of the venous system shows DVT from the popliteal to the distal femoral veins.  The patient is initiated on anticoagulation   Elevation was stressed, use of a recliner was discussed.  I have had discussion with the patient regarding DVT and post phlebitic changes such as swelling and why it  causes symptoms such as pain.  The patient will wear graduated compression stockings class 1 (20-30 mmHg), as tolerated given her fracture, on a daily basis a prescription was given. The patient will  beginning wearing the stockings first thing in the morning and removing them in the evening. The patient is instructed specifically not to sleep in the stockings.  In addition, behavioral modification including elevation during the day and avoidance of prolonged dependency will  be initiated.    The patient will continue anticoagulation for now as there have not been any problems or complications at this point.   - VAS [place] LOWER EXTREMITY VENOUS (DVT); Future  2. Closed fracture of proximal end of left tibia, unspecified fracture morphology, sequela Plan per Ortho  3. Mixed hyperlipidemia Continue statin as ordered and reviewed, no changes at this time   4. Primary hypertension Continue antihypertensive medications as already ordered, these medications have been reviewed and there are no changes at this time.  Levora Dredge, MD  11/26/2020 12:57 PM

## 2020-11-29 ENCOUNTER — Encounter (INDEPENDENT_AMBULATORY_CARE_PROVIDER_SITE_OTHER): Payer: Self-pay | Admitting: Vascular Surgery

## 2020-12-01 ENCOUNTER — Other Ambulatory Visit: Payer: Self-pay

## 2020-12-01 ENCOUNTER — Ambulatory Visit: Payer: Managed Care, Other (non HMO) | Attending: Orthopedic Surgery | Admitting: Physical Therapy

## 2020-12-01 DIAGNOSIS — M25562 Pain in left knee: Secondary | ICD-10-CM | POA: Diagnosis present

## 2020-12-01 DIAGNOSIS — M6281 Muscle weakness (generalized): Secondary | ICD-10-CM

## 2020-12-01 NOTE — Patient Instructions (Signed)
Access Code: [email or web address]: [email or web address]: 02/15/2022Prepared by: Casimiro Needle SherkExercises  Straight Leg Raise - 1 x daily - 7 x weekly - 2 sets - 10 reps  Seated March - 1 x daily - 7 x weekly - 2 sets - 10 reps  Clamshell - 1 x daily - 7 x weekly - 2 sets - 10 reps  Standing Heel Raises - 1 x daily - 7 x weekly - 2 sets - 10 reps  Standing Knee Flexion - 1 x daily - 7 x weekly - 2 sets - 10 reps

## 2020-12-06 NOTE — Therapy (Signed)
[facility] [address] [place], [place], [address] Phone: [phone]   Fax:  [phone]  Physical Therapy Evaluation  Patient Details  Name: Donna Liu MRN: [id number] Date of Birth: 10-20-1965 Referring Provider (PT): Cassell Smiles, MD   Encounter Date: 12/01/2020   PT End of Session - 12/06/20 1454    Visit Number 1    Number of Visits 5    Date for PT Re-Evaluation 12/29/20    Authorization - Visit Number 1    Authorization - Number of Visits 10    PT Start Time 1346    PT Stop Time 1430    PT Time Calculation (min) 44 min    Activity Tolerance Patient tolerated treatment well    Behavior During Therapy [facility] for tasks assessed/performed           Past Medical History:  Diagnosis Date  . Acid reflux   . Adrenal nodule (HCC)   . Allergy   . Anxiety   . CRPS (complex regional pain syndrome type I)   . Depression   . Fibromyalgia   . Hypertension   . PTSD (post-traumatic stress disorder)     Past Surgical History:  Procedure Laterality Date  . ABDOMINAL HYSTERECTOMY    . APPENDECTOMY    . CHOLECYSTECTOMY    . FOOT SURGERY Right   . GASTRIC BYPASS    . TONSILLECTOMY      There were no vitals filed for this visit.    Subjective Assessment - 12/06/20 1433    Subjective Pt. fractured upper end of L tibia after falling at home in mid December 2021.  Pt. was put in immobilizer with marked swelling in L knee/lower leg resulitng in DVT.  Pt. states she is getting better and better but still has L knee discomfort and slight antalgic gait.    Pertinent History pt. known well to PT clinic    Limitations House hold activities;Walking;Standing    Patient Stated Goals Increase L LE muscle strengthening/ pain-free mobility.    Currently in Pain? Yes    Pain Score 3     Pain Location Knee    Pain Orientation Left              [facility] PT Assessment - 12/06/20 0001      Assessment   Medical Diagnosis  Unspecified fracture of upper end of L tibia    Referring Provider (PT) Cassell Smiles, MD    Onset Date/Surgical Date 10/04/20    Prior Therapy Pt. known to PT clinic      Balance Screen   Has the patient fallen in the past 6 months Yes    Has the patient had a decrease in activity level because of a fear of falling?  Yes      Prior Function   Level of Independence Independent      Cognition   Overall Cognitive Status Within Functional Limits for tasks assessed                      Objective measurements completed on examination: See above findings.               PT Education - 12/06/20 1448    Education provided Yes    Education Details See HEP    Person(s) Educated Patient    Methods Explanation;Demonstration;Handout    Comprehension Verbalized understanding;Returned demonstration  PT Long Term Goals - 04/10/20 1001      PT LONG TERM GOAL #1   Title Pt. I with HEP to increase lumbar flexion/ core stability to WNL to improve pain-free mobility.    Baseline Pt. currently not exercising.  Issued core program. (page #1-2)    Time 4    Period Weeks    Status New    Target Date 05/06/20      PT LONG TERM GOAL #2   Title Pt. will report no lumbar paraspinal muscle tenderness/ cramps with palpation to improve pain-free mobility.    Baseline Pt. reports muscle cramping in low back with increase activity/ swimming in pool    Time 4    Period Weeks    Status New    Target Date 05/06/20      PT LONG TERM GOAL #3   Title Pt. able to complete 30 minutes of household chores with no increase c/o back pain or radicular symptoms.    Baseline Increase c/o pain with activity    Time 4    Period Weeks    Status New    Target Date 05/06/20                   Patient will benefit from skilled therapeutic intervention in order to improve the following deficits and impairments:     Visit Diagnosis: Acute pain of left knee  Muscle  weakness (generalized)     Problem List Patient Active Problem List   Diagnosis Date Noted  . DVT femoral (deep venous thrombosis) with thrombophlebitis, left (HCC) 10/27/2020  . Closed fracture of upper end of tibia 10/27/2020  . Hypertension 09/21/2020  . Memory loss 09/21/2020  . Encounter to establish care with new doctor 09/21/2020  . Adrenal nodule (HCC) 09/21/2020  . Anxiety 09/21/2020  . Depression 09/21/2020  . PTSD (post-traumatic stress disorder) 09/21/2020  . CRPS (complex regional pain syndrome type I)   . Enthesopathy of right hip 01/29/2016  . Status post bariatric surgery 02/17/2014  . Fibromyalgia 05/29/2013  . Mixed hyperlipidemia 04/22/2011    Cammie Mcgee 12/06/2020, 3:06 PM  [facility] [address] [place], [place], [address] Phone: [phone]   Fax:  [phone]  Name: Donna Liu MRN: [id number] Date of Birth: 1966-04-06

## 2021-01-06 DIAGNOSIS — R251 Tremor, unspecified: Secondary | ICD-10-CM | POA: Insufficient documentation

## 2021-03-16 ENCOUNTER — Other Ambulatory Visit: Payer: Self-pay

## 2021-03-16 ENCOUNTER — Other Ambulatory Visit (INDEPENDENT_AMBULATORY_CARE_PROVIDER_SITE_OTHER): Payer: Self-pay | Admitting: Vascular Surgery

## 2021-03-16 ENCOUNTER — Encounter: Payer: Self-pay | Admitting: Internal Medicine

## 2021-03-16 ENCOUNTER — Ambulatory Visit: Payer: Managed Care, Other (non HMO) | Admitting: Internal Medicine

## 2021-03-16 VITALS — BP 127/84 | HR 102 | Temp 98.4°F | Resp 17 | Ht 68.0 in | Wt 205.4 lb

## 2021-03-16 DIAGNOSIS — I10 Essential (primary) hypertension: Secondary | ICD-10-CM

## 2021-03-16 DIAGNOSIS — F419 Anxiety disorder, unspecified: Secondary | ICD-10-CM

## 2021-03-16 DIAGNOSIS — Z6831 Body mass index (BMI) 31.0-31.9, adult: Secondary | ICD-10-CM

## 2021-03-16 DIAGNOSIS — Z86718 Personal history of other venous thrombosis and embolism: Secondary | ICD-10-CM

## 2021-03-16 DIAGNOSIS — F4310 Post-traumatic stress disorder, unspecified: Secondary | ICD-10-CM

## 2021-03-16 DIAGNOSIS — M797 Fibromyalgia: Secondary | ICD-10-CM

## 2021-03-16 DIAGNOSIS — Z9884 Bariatric surgery status: Secondary | ICD-10-CM

## 2021-03-16 DIAGNOSIS — E6609 Other obesity due to excess calories: Secondary | ICD-10-CM

## 2021-03-16 DIAGNOSIS — G90529 Complex regional pain syndrome I of unspecified lower limb: Secondary | ICD-10-CM | POA: Diagnosis not present

## 2021-03-16 DIAGNOSIS — I82402 Acute embolism and thrombosis of unspecified deep veins of left lower extremity: Secondary | ICD-10-CM

## 2021-03-16 DIAGNOSIS — E782 Mixed hyperlipidemia: Secondary | ICD-10-CM

## 2021-03-16 DIAGNOSIS — F32A Depression, unspecified: Secondary | ICD-10-CM

## 2021-03-16 NOTE — Assessment & Plan Note (Signed)
Managed on Lidocaine infusions and Burnorphine intermittently Follows with pain clinic

## 2021-03-16 NOTE — Assessment & Plan Note (Signed)
Provoked Continue Eliquis per vascular

## 2021-03-16 NOTE — Assessment & Plan Note (Signed)
Managed on Sertraline, Seroquel and Clonazepam She follows with psychiatry Support offered 

## 2021-03-16 NOTE — Progress Notes (Signed)
Subjective:    Patient ID: Donna Liu, female    DOB: [birth year], 55 y.o.   MRN: [id number]  HPI  Pt presents to the clinic today for follow up of chronic conditions. She is establishing care with me today, transferring care from Danielle Rankin, NP.  HTN: Her BP today is 127/84. She is taking Amlodipine, Prazosin and Lisinopril as prescribed. ECG from 07/2015 reviewed.  Chronic Pain Syndrome, Fibromyalgia: Managed with Lidocaine infusions, Burenorphine intermittently. She follows with pain management.  Anxiety, Depression and PTSD: Chronic, managed on  Sertraline, Seroquel and Clonazepam. She is not currently seeing a therapist. She denies SI/HI.  HLD: Her last LDL was 138, triglycerides 68, 08/2019. She is not taking any cholesterol lowering medication at this time. She tries to consume a low fat diet.  Hx of DVT: Managed on Apixiban. She follows with vascular surgery.  Review of Systems      Past Medical History:  Diagnosis Date  . Acid reflux   . Adrenal nodule (HCC)   . Allergy   . Anxiety   . CRPS (complex regional pain syndrome type I)   . Depression   . Fibromyalgia   . Hypertension   . PTSD (post-traumatic stress disorder)     Current Outpatient Medications  Medication Sig Dispense Refill  . amLODipine (NORVASC) 10 MG tablet Take 1 tablet (10 mg total) by mouth daily. 90 tablet 1  . apixaban (ELIQUIS) 5 MG TABS tablet Take 1 tablet (5 mg total) by mouth 2 (two) times daily. 60 tablet 5  . DULoxetine (CYMBALTA) 20 MG capsule Take 20 mg by mouth 2 (two) times daily.    Marland Kitchen lisinopril (ZESTRIL) 40 MG tablet Take 1 tablet daily 90 tablet 1  . pregabalin (LYRICA) 50 MG capsule Take 100 mg by mouth daily after lunch.    . sertraline (ZOLOFT) 100 MG tablet Take 150 mg by mouth daily.    . traMADol (ULTRAM) 50 MG tablet Take by mouth every 4 (four) hours as needed.    . traZODone (DESYREL) 50 MG tablet Take 50 mg by mouth at bedtime.     No current  facility-administered medications for this visit.    Allergies  Allergen Reactions  . Penicillins Anaphylaxis    Has patient had a PCN reaction causing immediate rash, facial/tongue/throat swelling, SOB or lightheadedness with hypotension: Yes Has patient had a PCN reaction causing severe rash involving mucus membranes or skin necrosis: No Has patient had a PCN reaction that required hospitalization Yes Has patient had a PCN reaction occurring within the last 10 years: No If all of the above answers are "NO", then may proceed with Cephalosporin use.   . Tapentadol Anaphylaxis  . Elemental Sulfur Hives and Rash  . Sulfa Antibiotics Hives and Rash    Family History  Problem Relation Age of Onset  . Hypertension Mother   . Hyperlipidemia Mother   . Atrial fibrillation Mother   . Healthy Father     Social History   Socioeconomic History  . Marital status: Married    Spouse name: Not on file  . Number of children: Not on file  . Years of education: Not on file  . Highest education level: Not on file  Occupational History  . Not on file  Tobacco Use  . Smoking status: Current Every Day Smoker    Packs/day: 0.75    Years: 35.00    Pack years: 26.25    Types: Cigarettes  . Smokeless tobacco: Never  Used  Vaping Use  . Vaping Use: Never used  Substance and Sexual Activity  . Alcohol use: Not Currently    Comment: occasional   . Drug use: Never  . Sexual activity: Not on file  Other Topics Concern  . Not on file  Social History Narrative  . Not on file   Social Determinants of Health   Financial Resource Strain: Not on file  Food Insecurity: Not on file  Transportation Needs: Not on file  Physical Activity: Not on file  Stress: Not on file  Social Connections: Not on file  Intimate Partner Violence: Not on file     Constitutional: Pt reports chronic fatigue. Denies fever, malaise, headache or abrupt weight changes.  HEENT: Denies eye pain, eye redness, ear  pain, ringing in the ears, wax buildup, runny nose, nasal congestion, bloody nose, or sore throat. Respiratory: Denies difficulty breathing, shortness of breath, cough or sputum production.   Cardiovascular: Denies chest pain, chest tightness, palpitations or swelling in the hands or feet.  Gastrointestinal: Denies abdominal pain, bloating, constipation, diarrhea or blood in the stool.  GU: Denies urgency, frequency, pain with urination, burning sensation, blood in urine, odor or discharge. Musculoskeletal: Pt reports chronic joint and muscle pain. Denies decrease in range of motion, difficulty with gait, or joint swelling.  Skin: Denies redness, rashes, lesions or ulcercations.  Neurological: Denies dizziness, difficulty with memory, difficulty with speech or problems with balance and coordination.  Psych: Pt has a history of anxiety and depression. Denies SI/HI.  No other specific complaints in a complete review of systems (except as listed in HPI above).  Objective:   Physical Exam  BP 127/84 (BP Location: Right Arm, Patient Position: Sitting)   Pulse (!) 102   Temp 98.4 F (36.9 C) (Temporal)   Resp 17   Ht 5\' 8"  (1.727 m)   Wt 205 lb 6.4 oz (93.2 kg)   SpO2 99%   BMI 31.23 kg/m   Wt Readings from Last 3 Encounters:  11/26/20 204 lb 9.6 oz (92.8 kg)  11/11/20 204 lb (92.5 kg)  10/26/20 202 lb (91.6 kg)    General: Appears her stated age, obese, in NAD. Skin: Warm, dry and intact. No rashesnoted. HEENT: Head: normal shape and size; Eyes: sclera white and EOMs intact;  Neck:  Neck supple, trachea midline. No masses, lumps or thyromegaly present.  Cardiovascular: Normal rate and rhythm. S1,S2 noted.  No murmur, rubs or gallops noted. No JVD or BLE edema. No carotid bruits noted. Pulmonary/Chest: Normal effort and positive vesicular breath sounds. No respiratory distress. No wheezes, rales or ronchi noted.  Musculoskeletal: Strength 5/5 BUE/BLE. Gait slow and steady without  device. Neurological: Alert and oriented.  Psychiatric: Mood and affect flat. Behavior is normal. Judgment and thought content normal.    BMET    Component Value Date/Time   NA 138 10/20/2020 1706   K 4.6 10/20/2020 1706   CL 102 10/20/2020 1706   CO2 27 10/20/2020 1706   GLUCOSE 145 (H) 10/20/2020 1706   BUN 13 10/20/2020 1706   CREATININE 0.63 10/20/2020 1706   CALCIUM 9.4 10/20/2020 1706   GFRNONAA >60 10/20/2020 1706   GFRAA >60 08/08/2015 1134    Lipid Panel  No results found for: CHOL, TRIG, HDL, CHOLHDL, VLDL, LDLCALC  CBC    Component Value Date/Time   WBC 9.9 10/20/2020 1706   RBC 4.40 10/20/2020 1706   HGB 13.7 10/20/2020 1706   HCT 41.1 10/20/2020 1706   PLT  270 10/20/2020 1706   MCV 93.4 10/20/2020 1706   MCH 31.1 10/20/2020 1706   MCHC 33.3 10/20/2020 1706   RDW 12.8 10/20/2020 1706    Hgb A1C No results found for: HGBA1C         Assessment & Plan:   Hx of Gastric Bypass:  Will check TSH, Vit D, B12 and Folate today  Schedule an appt for your annual exam Nicki Reaper, NP This visit occurred during the SARS-CoV-2 public health emergency.  Safety protocols were in place, including screening questions prior to the visit, additional usage of staff PPE, and extensive cleaning of exam room while observing appropriate contact time as indicated for disinfecting solutions.

## 2021-03-16 NOTE — Assessment & Plan Note (Signed)
Managed on Sertraline, Seroquel and Clonazepam She follows with psychiatry Support offered

## 2021-03-16 NOTE — Patient Instructions (Signed)

## 2021-03-16 NOTE — Assessment & Plan Note (Signed)
Managed with Lidocaine infusion and Burenorphine as needed

## 2021-03-16 NOTE — Assessment & Plan Note (Signed)
Controlled on Amlodipine, Prazosin and Lisinopril Reinforced DASH diet and exercise for weight loss

## 2021-03-16 NOTE — Assessment & Plan Note (Signed)
CMET and lipid profile today Encouraged her to consume a low fat diet 

## 2021-03-17 NOTE — Progress Notes (Signed)
MRN : [id number]  Donna Liu is a 55 y.o. (1966-10-12) female who presents with chief complaint of No chief complaint on file. Marland Kitchen  History of Present Illness:  The patient presents to the office for follow up evaluation of DVT.  DVT was identified at [facility] by Duplex ultrasound.  The initial symptoms were pain and swelling in the lower extremity.  The patient notes the leg continues to be very painful with dependency and swells quite a bite.  Symptoms are much better with elevation.  The patient notes minimal edema in the morning which steadily worsens throughout the day.    The patient has not been using compression therapy at this point.  No SOB or pleuritic chest pains.  No cough or hemoptysis.  No blood per rectum or blood in any sputum.  No excessive bruising per the patient.  Duplex ultrasound shows the deep venous system is patent and fully compressible   No outpatient medications have been marked as taking for the 03/18/21 encounter (Appointment) with Gilda Crease, Latina Craver, MD.    Past Medical History:  Diagnosis Date  . Acid reflux   . Adrenal nodule (HCC)   . Allergy   . Anxiety   . CRPS (complex regional pain syndrome type I)   . Depression   . Fibromyalgia   . Hypertension   . PTSD (post-traumatic stress disorder)     Past Surgical History:  Procedure Laterality Date  . ABDOMINAL HYSTERECTOMY    . APPENDECTOMY    . CHOLECYSTECTOMY    . FOOT SURGERY Right   . GASTRIC BYPASS    . TONSILLECTOMY      Social History Social History   Tobacco Use  . Smoking status: Current Every Day Smoker    Packs/day: 0.75    Years: 35.00    Pack years: 26.25    Types: Cigarettes  . Smokeless tobacco: Never Used  Vaping Use  . Vaping Use: Never used  Substance Use Topics  . Alcohol use: Yes    Comment: occasional   . Drug use: Never    Family History Family History  Problem Relation Age of Onset  . Hypertension Mother   . Hyperlipidemia Mother   .  Atrial fibrillation Mother   . Healthy Father     Allergies  Allergen Reactions  . Penicillins Anaphylaxis    Has patient had a PCN reaction causing immediate rash, facial/tongue/throat swelling, SOB or lightheadedness with hypotension: Yes Has patient had a PCN reaction causing severe rash involving mucus membranes or skin necrosis: No Has patient had a PCN reaction that required hospitalization Yes Has patient had a PCN reaction occurring within the last 10 years: No If all of the above answers are "NO", then may proceed with Cephalosporin use.   . Tapentadol Anaphylaxis  . Elemental Sulfur Hives and Rash  . Sulfa Antibiotics Hives and Rash     REVIEW OF SYSTEMS (Negative unless checked)  Constitutional: [] Weight loss  [] Fever  [] Chills Cardiac: [] Chest pain   [] Chest pressure   [] Palpitations   [] Shortness of breath when laying flat   [] Shortness of breath with exertion. Vascular:  [] Pain in legs with walking   [] Pain in legs at rest  [x] History of DVT   [] Phlebitis   [x] Swelling in legs   [] Varicose veins   [] Non-healing ulcers Pulmonary:   [] Uses home oxygen   [] Productive cough   [] Hemoptysis   [] Wheeze  [] COPD   [] Asthma Neurologic:  [] Dizziness   [] Seizures   []   History of stroke   [] History of TIA  [] Aphasia   [] Vissual changes   [] Weakness or numbness in arm   [] Weakness or numbness in leg Musculoskeletal:   [] Joint swelling   [] Joint pain   [] Low back pain Hematologic:  [] Easy bruising  [] Easy bleeding   [] Hypercoagulable state   [] Anemic Gastrointestinal:  [] Diarrhea   [] Vomiting  [] Gastroesophageal reflux/heartburn   [] Difficulty swallowing. Genitourinary:  [] Chronic kidney disease   [] Difficult urination  [] Frequent urination   [] Blood in urine Skin:  [] Rashes   [] Ulcers  Psychological:  [] History of anxiety   []  History of major depression.  Physical Examination  There were no vitals filed for this visit. There is no height or weight on file to calculate BMI. Gen:  WD/WN, NAD Head: [place]/AT, No temporalis wasting.  Ear/Nose/Throat: Hearing grossly intact, nares w/o erythema or drainage Eyes: PER, EOMI, sclera nonicteric.  Neck: Supple, no large masses.   Pulmonary:  Good air movement, no audible wheezing bilaterally, no use of accessory muscles.  Cardiac: RRR, no JVD Vascular: scattered varicosities present bilaterally.  Mild venous stasis changes to the legs bilaterally.  2+ soft pitting edema. Vessel Right Left  Radial Palpable Palpable  Gastrointestinal: Non-distended. No guarding/no peritoneal signs.  Musculoskeletal: M/S 5/5 throughout.  No deformity or atrophy.  Neurologic: CN 2-12 intact. Symmetrical.  Speech is fluent. Motor exam as listed above. Psychiatric: Judgment intact, Mood & affect appropriate for pt's clinical situation. Dermatologic: No rashes or ulcers noted.  No changes consistent with cellulitis.  CBC Lab Results  Component Value Date   WBC 9.9 10/20/2020   HGB 13.7 10/20/2020   HCT 41.1 10/20/2020   MCV 93.4 10/20/2020   PLT 270 10/20/2020    BMET    Component Value Date/Time   NA 138 10/20/2020 1706   K 4.6 10/20/2020 1706   CL 102 10/20/2020 1706   CO2 27 10/20/2020 1706   GLUCOSE 145 (H) 10/20/2020 1706   BUN 13 10/20/2020 1706   CREATININE 0.63 10/20/2020 1706   CALCIUM 9.4 10/20/2020 1706   GFRNONAA >60 10/20/2020 1706   GFRAA >60 08/08/2015 1134   CrCl cannot be calculated (Patient's most recent lab result is older than the maximum 21 days allowed.).  COAG No results found for: INR, PROTIME  Radiology No results found.   Assessment/Plan 1. History of DVT (deep vein thrombosis) Recommend:   No surgery or intervention at this point in time.  IVC filter is not indicated at present.  Patient's duplex ultrasound of the venous system shows resolution of the previous DVT.  The patient can stop her anticoagulation.   Elevation was stressed, use of a recliner was discussed.  I have reviewed my  discussion with the patient regarding DVT and post phlebitic changes such as swelling and why it  causes symptoms such as pain.  The patient will wear graduated compression stockings, on a daily basis a prescription was given. The patient will  beginning wearing the stockings first thing in the morning and removing them in the evening. The patient is instructed specifically not to sleep in the stockings.  In addition, behavioral modification including elevation during the day and avoidance of prolonged dependency will be initiated.    The patient will follow up PRN   2. Mixed hyperlipidemia Continue statin as ordered and reviewed, no changes at this time   3. Primary hypertension Continue antihypertensive medications as already ordered, these medications have been reviewed and there are no changes at this time.  Levora Dredge, MD  03/17/2021 4:39 PM

## 2021-03-18 ENCOUNTER — Other Ambulatory Visit: Payer: Managed Care, Other (non HMO)

## 2021-03-18 ENCOUNTER — Ambulatory Visit (INDEPENDENT_AMBULATORY_CARE_PROVIDER_SITE_OTHER): Payer: Managed Care, Other (non HMO) | Admitting: Vascular Surgery

## 2021-03-18 ENCOUNTER — Encounter (INDEPENDENT_AMBULATORY_CARE_PROVIDER_SITE_OTHER): Payer: Self-pay | Admitting: Vascular Surgery

## 2021-03-18 ENCOUNTER — Other Ambulatory Visit: Payer: Self-pay

## 2021-03-18 ENCOUNTER — Ambulatory Visit (INDEPENDENT_AMBULATORY_CARE_PROVIDER_SITE_OTHER): Payer: Managed Care, Other (non HMO)

## 2021-03-18 VITALS — BP 151/103 | HR 94 | Resp 16 | Wt 204.0 lb

## 2021-03-18 DIAGNOSIS — I82402 Acute embolism and thrombosis of unspecified deep veins of left lower extremity: Secondary | ICD-10-CM

## 2021-03-18 DIAGNOSIS — I10 Essential (primary) hypertension: Secondary | ICD-10-CM

## 2021-03-18 DIAGNOSIS — E782 Mixed hyperlipidemia: Secondary | ICD-10-CM

## 2021-03-18 DIAGNOSIS — Z86718 Personal history of other venous thrombosis and embolism: Secondary | ICD-10-CM

## 2021-03-20 ENCOUNTER — Encounter (INDEPENDENT_AMBULATORY_CARE_PROVIDER_SITE_OTHER): Payer: Self-pay | Admitting: Vascular Surgery

## 2021-04-15 ENCOUNTER — Encounter: Payer: Self-pay | Admitting: *Deleted

## 2021-04-15 ENCOUNTER — Emergency Department
Admission: EM | Admit: 2021-04-15 | Discharge: 2021-04-15 | Disposition: A | Discharge status: Home / Self Care | Payer: Managed Care, Other (non HMO) | Attending: Emergency Medicine | Admitting: Emergency Medicine

## 2021-04-15 ENCOUNTER — Other Ambulatory Visit: Payer: Self-pay

## 2021-04-15 DIAGNOSIS — R1010 Upper abdominal pain, unspecified: Secondary | ICD-10-CM | POA: Insufficient documentation

## 2021-04-15 DIAGNOSIS — Z5321 Procedure and treatment not carried out due to patient leaving prior to being seen by health care provider: Secondary | ICD-10-CM | POA: Diagnosis not present

## 2021-04-15 DIAGNOSIS — M545 Low back pain, unspecified: Secondary | ICD-10-CM | POA: Insufficient documentation

## 2021-04-15 LAB — COMPREHENSIVE METABOLIC PANEL
ALT: 25 U/L (ref 0–44)
AST: 22 U/L (ref 15–41)
Albumin: 4.2 g/dL (ref 3.5–5.0)
Alkaline Phosphatase: 75 U/L (ref 38–126)
Anion gap: 9 (ref 5–15)
BUN: 14 mg/dL (ref 6–20)
CO2: 24 mmol/L (ref 22–32)
Calcium: 9.3 mg/dL (ref 8.9–10.3)
Chloride: 106 mmol/L (ref 98–111)
Creatinine, Ser: 0.84 mg/dL (ref 0.44–1.00)
GFR, Estimated: >60 mL/min (ref >60)
Glucose, Bld: 113 mg/dL — ABNORMAL HIGH (ref 70–99)
Potassium: 4.3 mmol/L (ref 3.5–5.1)
Sodium: 139 mmol/L (ref 135–145)
Total Bilirubin: 0.5 mg/dL (ref 0.3–1.2)
Total Protein: 7.4 g/dL (ref 6.5–8.1)

## 2021-04-15 LAB — URINALYSIS, COMPLETE (UACMP) WITH MICROSCOPIC
Bacteria, UA: NONE SEEN
Bilirubin Urine: NEGATIVE
Glucose, UA: NEGATIVE mg/dL
Ketones, ur: NEGATIVE mg/dL
Nitrite: NEGATIVE
Protein, ur: NEGATIVE mg/dL
Specific Gravity, Urine: 1.013 (ref 1.005–1.030)
pH: 6 (ref 5.0–8.0)

## 2021-04-15 LAB — CBC
HCT: 41.5 % (ref 36.0–46.0)
Hemoglobin: 14.2 g/dL (ref 12.0–15.0)
MCH: 31.1 pg (ref 26.0–34.0)
MCHC: 34.2 g/dL (ref 30.0–36.0)
MCV: 91 fL (ref 80.0–100.0)
Platelets: 260 10*3/uL (ref 150–400)
RBC: 4.56 MIL/uL (ref 3.87–5.11)
RDW: 13.5 % (ref 11.5–15.5)
WBC: 9.5 10*3/uL (ref 4.0–10.5)
nRBC: 0 % (ref 0.0–0.2)

## 2021-04-15 LAB — LIPASE, BLOOD: Lipase: 45 U/L (ref 11–51)

## 2021-04-15 NOTE — ED Triage Notes (Signed)
PT HAS UPPER ABD PAIN SINCE 1800 TONIGHT.  NO N/V/D.  PT HAS LOWER BACK PAIN.  DENIES URINARY SX.  PT TOOK PEPTO WITHOUT RELIEF  PT ALERT  SPEECH CLEAR.

## 2021-04-22 DIAGNOSIS — G479 Sleep disorder, unspecified: Secondary | ICD-10-CM | POA: Insufficient documentation

## 2021-05-13 ENCOUNTER — Other Ambulatory Visit: Payer: Self-pay | Admitting: Family Medicine

## 2021-05-13 DIAGNOSIS — Z1231 Encounter for screening mammogram for malignant neoplasm of breast: Secondary | ICD-10-CM

## 2021-05-25 ENCOUNTER — Ambulatory Visit
Admission: RE | Admit: 2021-05-25 | Discharge: 2021-05-25 | Disposition: A | Discharge status: Home / Self Care | Payer: Managed Care, Other (non HMO) | Source: Ambulatory Visit | Attending: Family Medicine | Admitting: Family Medicine

## 2021-05-25 ENCOUNTER — Other Ambulatory Visit: Payer: Self-pay

## 2021-05-25 DIAGNOSIS — Z1231 Encounter for screening mammogram for malignant neoplasm of breast: Secondary | ICD-10-CM | POA: Insufficient documentation

## 2021-05-31 ENCOUNTER — Inpatient Hospital Stay
Admission: RE | Admit: 2021-05-31 | Discharge: 2021-05-31 | Disposition: A | Discharge status: Home / Self Care | Payer: Self-pay | Source: Ambulatory Visit | Attending: *Deleted | Admitting: *Deleted

## 2021-05-31 ENCOUNTER — Other Ambulatory Visit: Payer: Self-pay | Admitting: *Deleted

## 2021-05-31 DIAGNOSIS — Z1231 Encounter for screening mammogram for malignant neoplasm of breast: Secondary | ICD-10-CM

## 2021-06-14 DIAGNOSIS — G629 Polyneuropathy, unspecified: Secondary | ICD-10-CM | POA: Insufficient documentation

## 2021-07-23 DIAGNOSIS — G8929 Other chronic pain: Secondary | ICD-10-CM | POA: Insufficient documentation

## 2021-07-23 DIAGNOSIS — M7061 Trochanteric bursitis, right hip: Secondary | ICD-10-CM | POA: Insufficient documentation

## 2021-08-01 ENCOUNTER — Other Ambulatory Visit: Payer: Self-pay

## 2021-08-01 ENCOUNTER — Ambulatory Visit
Admission: EM | Admit: 2021-08-01 | Discharge: 2021-08-01 | Disposition: A | Discharge status: Home / Self Care | Payer: Managed Care, Other (non HMO) | Attending: Medical Oncology | Admitting: Medical Oncology

## 2021-08-01 ENCOUNTER — Encounter: Payer: Self-pay | Admitting: Licensed Clinical Social Worker

## 2021-08-01 DIAGNOSIS — J069 Acute upper respiratory infection, unspecified: Secondary | ICD-10-CM | POA: Diagnosis not present

## 2021-08-01 MED ORDER — PROMETHAZINE-DM 6.25-15 MG/5ML PO SYRP: 5.0000 mL | ORAL_SOLUTION | Freq: Four times a day (QID) | ORAL | 0 refills | Status: DC | PRN

## 2021-08-01 MED ORDER — FLUTICASONE PROPIONATE 50 MCG/ACT NA SUSP: 2.0000 | Freq: Every day | NASAL | 0 refills | Status: DC

## 2021-08-01 MED ORDER — BENZONATATE 100 MG PO CAPS: 100.0000 mg | ORAL_CAPSULE | Freq: Three times a day (TID) | ORAL | 0 refills | Status: DC

## 2021-08-01 NOTE — ED Triage Notes (Signed)
Pt c/o cough, head congestion, ear pain x 3 days. Pt has tried robitussin and mucinex. Fever last night 101, covid test last night was negative.

## 2021-08-01 NOTE — ED Provider Notes (Signed)
[facility]    CSN: [id number] Arrival date & time: 08/01/21  0801      History   Chief Complaint Chief Complaint  Patient presents with   Cough   Nasal Congestion    HPI Donna Liu is a 55 y.o. female.   HPI  Cold Symptoms: Pt reports with dry cough, nasal congestion, body aches and a fever of 101F last night. Family members also sick with "bronchitis". She reports taking a home COVID-19 test this morning which was negative. She reports that she is not concerned nor believes that she has influenza. She has taken mucinex, dayquil for symptoms with minimal relief. No SOB, chest pain, vomiting, diarrhea.   Past Medical History:  Diagnosis Date   Acid reflux    Adrenal nodule (HCC)    Allergy    Anxiety    CRPS (complex regional pain syndrome type I)    Depression    Fibromyalgia    Hypertension    PTSD (post-traumatic stress disorder)     Patient Active Problem List   Diagnosis Date Noted   History of DVT (deep vein thrombosis) 03/16/2021   Tremor 01/06/2021   Hypertension 09/21/2020   Anxiety and depression 09/21/2020   PTSD (post-traumatic stress disorder) 09/21/2020   CRPS (complex regional pain syndrome type I)    Fibromyalgia 05/29/2013   Mixed hyperlipidemia 04/22/2011    Past Surgical History:  Procedure Laterality Date   ABDOMINAL HYSTERECTOMY     APPENDECTOMY     CHOLECYSTECTOMY     FOOT SURGERY Right    GASTRIC BYPASS     TONSILLECTOMY      OB History   No obstetric history on file.      Home Medications    Prior to Admission medications   Medication Sig Start Date End Date Taking? Authorizing Provider  amLODipine (NORVASC) 10 MG tablet Take 1 tablet (10 mg total) by mouth daily. 11/11/20  Yes Malfi, Jodelle Gross, FNP  apixaban (ELIQUIS) 5 MG TABS tablet Take 1 tablet (5 mg total) by mouth 2 (two) times daily. 10/27/20   Schnier, Latina Craver, MD  ARIPiprazole (ABILIFY) 10 MG tablet Take 1/2 tab nightly for a week then  increase to 1 tab at night and continue for a week if needed can take 1.5 tabs at night 03/17/21   [provider]  Buprenorphine HCl 300 MCG FILM Place inside cheek. Patient not taking: Reported on 03/18/2021 12/17/20   [provider]  cetirizine (ZYRTEC) 10 MG tablet Take 10 mg by mouth daily. 02/22/21   [provider]  clonazePAM (KLONOPIN) 0.5 MG tablet Take 0.5 mg by mouth 2 (two) times daily. 01/26/21   [provider]  lisinopril (ZESTRIL) 40 MG tablet Take 1 tablet daily 11/11/20   Malfi, Jodelle Gross, FNP  prazosin (MINIPRESS) 2 MG capsule Take 2 mg by mouth at bedtime. Patient not taking: Reported on 03/18/2021 11/23/20   [provider]  QUEtiapine (SEROQUEL) 25 MG tablet Take 25 milligrams at night for a week then increase to 50 milligrams at night for a week then increase to 75 milligrams at night and continue that dose Patient not taking: Reported on 03/18/2021 01/26/21   [provider]  sertraline (ZOLOFT) 100 MG tablet Take 50 mg by mouth daily. 09/21/17   [provider]    Family History Family History  Problem Relation Age of Onset   Hypertension Mother    Hyperlipidemia Mother    Atrial fibrillation Mother  Healthy Father    Breast cancer Neg Hx     Social History Social History   Tobacco Use   Smoking status: Every Day    Packs/day: 0.75    Years: 35.00    Pack years: 26.25    Types: Cigarettes   Smokeless tobacco: Never  Vaping Use   Vaping Use: Never used  Substance Use Topics   Alcohol use: Not Currently    Comment: occasional    Drug use: Never     Allergies   Penicillins, Tapentadol, Elemental sulfur, and Sulfa antibiotics   Review of Systems Review of Systems  As stated above in HPI Physical Exam Triage Vital Signs ED Triage Vitals  Enc Vitals Group     BP 08/01/21 0820 (!) 154/105     Pulse Rate 08/01/21 0820 88     Resp 08/01/21 0820 16     Temp 08/01/21 0820 98.4 F (36.9 C)     Temp  Source 08/01/21 0820 Oral     SpO2 08/01/21 0820 98 %     Weight 08/01/21 0815 207 lb (93.9 kg)     Height 08/01/21 0815 5\' 8"  (1.727 m)     Head Circumference --      Peak Flow --      Pain Score 08/01/21 0815 0     Pain Loc --      Pain Edu? --      Excl. in GC? --    No data found.  Updated Vital Signs BP (!) 154/105   Pulse 88   Temp 98.4 F (36.9 C) (Oral)   Resp 16   Ht 5\' 8"  (1.727 m)   Wt 207 lb (93.9 kg)   SpO2 98%   BMI 31.47 kg/m   Physical Exam Vitals and nursing note reviewed.  Constitutional:      General: She is not in acute distress.    Appearance: Normal appearance. She is not ill-appearing, toxic-appearing or diaphoretic.     Comments: Pt sounds congested  HENT:     Head: Normocephalic and atraumatic.     Right Ear: Tympanic membrane normal.     Left Ear: Tympanic membrane normal.     Nose: Congestion and rhinorrhea present.     Mouth/Throat:     Mouth: Mucous membranes are moist.     Pharynx: No oropharyngeal exudate or posterior oropharyngeal erythema.  Eyes:     Extraocular Movements: Extraocular movements intact.     Pupils: Pupils are equal, round, and reactive to light.  Cardiovascular:     Rate and Rhythm: Normal rate and regular rhythm.     Heart sounds: Normal heart sounds.  Pulmonary:     Effort: Pulmonary effort is normal.     Breath sounds: Normal breath sounds.  Musculoskeletal:     Cervical back: Normal range of motion and neck supple.  Lymphadenopathy:     Cervical: No cervical adenopathy.  Skin:    General: Skin is warm.  Neurological:     Mental Status: She is alert and oriented to person, place, and time.     UC Treatments / Results  Labs (all labs ordered are listed, but only abnormal results are displayed) Labs Reviewed - No data to display  EKG   Radiology No results found.  Procedures Procedures (including critical care time)  Medications Ordered in UC Medications - No data to display  Initial  Impression / Assessment and Plan / UC Course  I have reviewed the triage vital  signs and the nursing notes.  Pertinent labs & imaging results that were available during my care of the patient were reviewed by me and considered in my medical decision making (see chart for details).     New. Appears viral in nature which I discussed. Likely to self resolved within the next 7-14 days. Pt asks for promethazine cough syrup. Will also send in tessalon and flonase to help with symptoms along with rest and hydration. Discussed red flag signs and symptoms. Follow up PRN.  Final Clinical Impressions(s) / UC Diagnoses   Final diagnoses:  None   Discharge Instructions   None    ED Prescriptions   None    PDMP not reviewed this encounter.   Rushie Chestnut, [place] 08/01/21 0840

## 2021-08-08 ENCOUNTER — Ambulatory Visit
Admission: EM | Admit: 2021-08-08 | Discharge: 2021-08-08 | Disposition: A | Discharge status: Home / Self Care | Payer: Managed Care, Other (non HMO) | Attending: Internal Medicine | Admitting: Internal Medicine

## 2021-08-08 ENCOUNTER — Other Ambulatory Visit: Payer: Self-pay

## 2021-08-08 DIAGNOSIS — J019 Acute sinusitis, unspecified: Secondary | ICD-10-CM | POA: Diagnosis not present

## 2021-08-08 DIAGNOSIS — B9689 Other specified bacterial agents as the cause of diseases classified elsewhere: Secondary | ICD-10-CM | POA: Diagnosis not present

## 2021-08-08 MED ORDER — PROMETHAZINE-DM 6.25-15 MG/5ML PO SYRP
5.0000 mL | ORAL_SOLUTION | Freq: Four times a day (QID) | ORAL | 0 refills | Status: DC | PRN
Start: 2021-08-08 — End: 2022-01-19

## 2021-08-08 MED ORDER — DOXYCYCLINE HYCLATE 100 MG PO CAPS: 100.0000 mg | ORAL_CAPSULE | Freq: Two times a day (BID) | ORAL | 0 refills | Status: DC

## 2021-08-08 NOTE — ED Triage Notes (Signed)
Pt here with C/O cough, has got worst. Was seen 1 week ago for [place] Peers. Was on RX for cough and was better until Wednesday. Has been running fever off and on.

## 2021-08-08 NOTE — Discharge Instructions (Addendum)
Please take medications as prescribed Saline nasal spray and humidifier use will help with nasal congestion Continue using Flonase Return to urgent care if you have any worsening symptoms.

## 2021-08-08 NOTE — ED Provider Notes (Signed)
[facility]    CSN: [id number] Arrival date & time: 08/08/21  0915      History   Chief Complaint Chief Complaint  Patient presents with   Cough    HPI Donna Liu is a 55 y.o. female comes to the urgent care with worsening cough, postnasal drip, greenish nasal discharge and intermittent fever.  Patient was seen here last week for upper respiratory infection with a cough.  She was prescribed nasal spray, cough medication.  Patient's symptoms initially got better but started getting worse over the past few days.  Nasal discharge is purulent.  She has pain over her face.  She is experiencing intermittent fever.  Temperature yesterday was 102 Fahrenheit.  She continues to have postnasal drip but denies any shortness of breath or wheezing.   HPI  Past Medical History:  Diagnosis Date   Acid reflux    Adrenal nodule (HCC)    Allergy    Anxiety    CRPS (complex regional pain syndrome type I)    Depression    Fibromyalgia    Hypertension    PTSD (post-traumatic stress disorder)     Patient Active Problem List   Diagnosis Date Noted   History of DVT (deep vein thrombosis) 03/16/2021   Tremor 01/06/2021   Hypertension 09/21/2020   Anxiety and depression 09/21/2020   PTSD (post-traumatic stress disorder) 09/21/2020   CRPS (complex regional pain syndrome type I)    Fibromyalgia 05/29/2013   Mixed hyperlipidemia 04/22/2011    Past Surgical History:  Procedure Laterality Date   ABDOMINAL HYSTERECTOMY     APPENDECTOMY     CHOLECYSTECTOMY     FOOT SURGERY Right    GASTRIC BYPASS     TONSILLECTOMY      OB History   No obstetric history on file.      Home Medications    Prior to Admission medications   Medication Sig Start Date End Date Taking? Authorizing Provider  amLODipine (NORVASC) 10 MG tablet Take 1 tablet (10 mg total) by mouth daily. 11/11/20  Yes Malfi, Jodelle Gross, FNP  apixaban (ELIQUIS) 5 MG TABS tablet Take 1 tablet (5 mg total) by mouth  2 (two) times daily. 10/27/20  Yes Schnier, Latina Craver, MD  ARIPiprazole (ABILIFY) 10 MG tablet Take 1/2 tab nightly for a week then increase to 1 tab at night and continue for a week if needed can take 1.5 tabs at night 03/17/21  Yes [provider]  benzonatate (TESSALON) 100 MG capsule Take 1 capsule (100 mg total) by mouth every 8 (eight) hours. 08/01/21  Yes Covington, Sarah M, PA-C  Buprenorphine HCl 300 MCG FILM Place inside cheek. 12/17/20  Yes [provider]  cetirizine (ZYRTEC) 10 MG tablet Take 10 mg by mouth daily. 02/22/21  Yes [provider]  clonazePAM (KLONOPIN) 0.5 MG tablet Take 0.5 mg by mouth 2 (two) times daily. 01/26/21  Yes [provider]  doxycycline (VIBRAMYCIN) 100 MG capsule Take 1 capsule (100 mg total) by mouth 2 (two) times daily. 08/08/21  Yes Arshad Oberholzer, Britta Mccreedy, MD  fluticasone (FLONASE) 50 MCG/ACT nasal spray Place 2 sprays into both nostrils daily. 08/01/21  Yes Clent Jacks M, PA-C  lisinopril (ZESTRIL) 40 MG tablet Take 1 tablet daily 11/11/20  Yes Malfi, Jodelle Gross, FNP  prazosin (MINIPRESS) 2 MG capsule Take 2 mg by mouth at bedtime. 11/23/20  Yes [provider]  promethazine-dextromethorphan (PROMETHAZINE-DM) 6.25-15 MG/5ML syrup Take 5 mLs by mouth 4 (four) times  daily as needed for cough. 08/08/21  Yes Hiep Ollis, Britta Mccreedy, MD  QUEtiapine (SEROQUEL) 25 MG tablet  01/26/21  Yes [provider]  sertraline (ZOLOFT) 100 MG tablet Take 50 mg by mouth daily. 09/21/17  Yes [provider]    Family History Family History  Problem Relation Age of Onset   Hypertension Mother    Hyperlipidemia Mother    Atrial fibrillation Mother    Healthy Father    Breast cancer Neg Hx     Social History Social History   Tobacco Use   Smoking status: Every Day    Packs/day: 0.75    Years: 35.00    Pack years: 26.25    Types: Cigarettes   Smokeless tobacco: Never  Vaping Use   Vaping Use: Never used  Substance Use  Topics   Alcohol use: Not Currently    Comment: occasional    Drug use: Never     Allergies   Penicillins, Tapentadol, Elemental sulfur, and Sulfa antibiotics   Review of Systems Review of Systems  Constitutional: Negative.   HENT:  Positive for congestion, postnasal drip, sinus pressure, sinus pain and sore throat. Negative for rhinorrhea.   Respiratory:  Positive for cough.   Cardiovascular: Negative.     Physical Exam Triage Vital Signs ED Triage Vitals  Enc Vitals Group     BP 08/08/21 1005 (!) 143/88     Pulse Rate 08/08/21 1005 90     Resp 08/08/21 1005 18     Temp 08/08/21 1005 99 F (37.2 C)     Temp Source 08/08/21 1005 Oral     SpO2 08/08/21 1005 98 %     Weight 08/08/21 1002 207 lb (93.9 kg)     Height 08/08/21 1002 5\' 8"  (1.727 m)     Head Circumference --      Peak Flow --      Pain Score 08/08/21 1004 0     Pain Loc --      Pain Edu? --      Excl. in GC? --    No data found.  Updated Vital Signs BP (!) 143/88 (BP Location: Left Arm)   Pulse 90   Temp 99 F (37.2 C) (Oral)   Resp 18   Ht 5\' 8"  (1.727 m)   Wt 93.9 kg   SpO2 98%   BMI 31.47 kg/m   Visual Acuity Right Eye Distance:   Left Eye Distance:   Bilateral Distance:    Right Eye Near:   Left Eye Near:    Bilateral Near:     Physical Exam   UC Treatments / Results  Labs (all labs ordered are listed, but only abnormal results are displayed) Labs Reviewed - No data to display  EKG   Radiology No results found.  Procedures Procedures (including critical care time)  Medications Ordered in UC Medications - No data to display  Initial Impression / Assessment and Plan / UC Course  I have reviewed the triage vital signs and the nursing notes.  Pertinent labs & imaging results that were available during my care of the patient were reviewed by me and considered in my medical decision making (see chart for details).     1.  Acute bacterial sinusitis greater than 10  days: Doxycycline 100 mg twice daily for 10 days Promethazine-dextromethorphan as needed for cough Continue fluticasone nasal spray Saline nasal spray Return to urgent care if symptom worsens. Final Clinical Impressions(s) / UC Diagnoses  Final diagnoses:  Acute bacterial sinusitis     Discharge Instructions      Please take medications as prescribed Saline nasal spray and humidifier use will help with nasal congestion Continue using Flonase Return to urgent care if you have any worsening symptoms.   ED Prescriptions     Medication Sig Dispense Auth. Provider   doxycycline (VIBRAMYCIN) 100 MG capsule Take 1 capsule (100 mg total) by mouth 2 (two) times daily. 20 capsule Skyleigh Windle, Britta Mccreedy, MD   promethazine-dextromethorphan (PROMETHAZINE-DM) 6.25-15 MG/5ML syrup Take 5 mLs by mouth 4 (four) times daily as needed for cough. 240 mL Lachelle Rissler, Britta Mccreedy, MD      PDMP not reviewed this encounter.   Merrilee Jansky, MD 08/08/21 1134

## 2021-12-15 ENCOUNTER — Ambulatory Visit: Payer: Managed Care, Other (non HMO) | Admitting: Pain Medicine

## 2022-01-17 ENCOUNTER — Ambulatory Visit: Payer: Managed Care, Other (non HMO) | Admitting: Pain Medicine

## 2022-01-17 NOTE — Progress Notes (Signed)
Patient: Donna Liu  Service Category: E/M  Provider: Oswaldo Done, MD  ?DOB: [birth year]  DOS: 01/19/2022  Referring Provider: Wilford Corner  ?MRN: [id number]  Setting: Ambulatory outpatient  PCP: Lorre Munroe, NP  ?Type: New Patient  Specialty: Interventional Pain Management    ?Location: Office  Delivery: Face-to-face    ? ?Primary Reason(s) for Visit: Encounter for initial evaluation of one or more chronic problems (new to examiner) potentially causing chronic pain, and posing a threat to normal musculoskeletal function. (Level of risk: High) ?CC: Joint Pain (Arthritis, CRPS), joint, and Muscle Pain (fibromyalgia) ? ?HPI  ?Donna Liu is a 56 y.o. year old, female patient, who comes for the first time to our practice referred by Wilford Corner,* for our initial evaluation of her chronic pain. She has Essential hypertension; Memory change; Fibromyalgia (6th area of Pain); Anxiety and depression; PTSD (post-traumatic stress disorder); Mixed hyperlipidemia; Complex regional pain syndrome type 1 of right lower extremity; History of DVT (deep vein thrombosis); Tremor; Chronic hip pain (1ry area of Pain) (Bilateral) (R>L); Neuropathy; Sleep disorder; Trochanteric bursitis of right hip; Chronic pain syndrome; Pharmacologic therapy; Disorder of skeletal system; Problems influencing health status; Chronic lower extremity pain (2ry area of Pain) (Bilateral) (L>R); Chronic hand pain (3ry area of Pain) (Bilateral) (R>L); Chronic knee pain (4th area of Pain) (Bilateral) (R>L); and Chronic upper back pain (5th area of Pain) (Midline) on their problem list. Today she comes in for evaluation of her Joint Pain (Arthritis, CRPS), joint, and Muscle Pain (fibromyalgia) ? ?Pain Assessment: ?Location:   Other (Comment) (joint pain) ?Radiating: n/a ?Onset: More than a month ago ?Duration: Chronic pain ?Quality: Stabbing, Aching, Dull, Throbbing ?Severity: 5 /10 (subjective, self-reported pain score)   ?Effect on ADL: difficulty performing daily activities ?Timing: Constant ?Modifying factors: hot bath, walking, Tramadol ?BP: (!) 151/94  HR: 89 ? ?Onset and Duration: Present longer than 3 months ?Cause of pain: Unknown ?Severity: NAS-11 at its worse: 8/10, NAS-11 at its best: 2/10, NAS-11 now: 5/10, and NAS-11 on the average: 6/10 ?Timing: Not influenced by the time of the day ?Aggravating Factors:  nothing listed ?Alleviating Factors: Lying down, Medications, Resting, Standing, Warm showers or baths, and Walking ?Associated Problems: Depression, Fatigue, Numbness, Personality changes, Sadness, Spasms, Swelling, Temperature changes, Tingling, Weakness, and Pain that wakes patient up ?Quality of Pain: Aching, Annoying, Constant, Cramping, Deep, Feeling of constriction, Pressure-like, Sharp, Shooting, Stabbing, Throbbing, Tingling, and Uncomfortable ?Previous Examinations or Tests: Bone scan, EMG/PNCV, MRI scan, Nerve block, X-rays, Nerve conduction test, Neurological evaluation, and Psychiatric evaluation ?Previous Treatments: Biofeedback, Epidural steroid injections, Narcotic medications, Physical Therapy, Pool exercises, TENS, and Trigger point injections ? ?According to the patient, she came in for an evaluation around 2011.  That is prior to this switch to the epic EMR and therefore it is not immediately available.  The patient indicates that at the time I was treating her for lower extremity CRPS and she thinks that she had approximately 9 different lumbar sympathetic block treatments.  After that, she moved her care to [facility] where they started treating her with methadone 5 mg p.o. at bedtime and they proposed a possible spinal cord stimulator trial/implant.  The patient indicates that during the course of her treatment her physician wanted to increase her methadone to 30 mg/day but she had a disagreement with them due to the fact that the 5 mg were working and she did not feel that she needed  to have that increase.  She refers that because of that disagreement, she was discharged from the practice due to "noncompliance".  Later on [facility] went [facility] to [facility] where she was being treated with Vicodin 5/325 4 times daily, but again she refers that she was dropped when they attempted to increase the dose and she did not agree with that.  She indicates that again she was dropped due to "failure to comply". ? ?According to the patient the primary area of pain is that of the hips (Bilateral) (R>L). The patient indicates having had surgery on her right hip, while [place] was living in [place].  She refers that this surgery was an exploratory surgery and they found her to have some tears as well as some muscle problems for which she indicates she had a "muscle transfer".  She indicates having had some fairly recent x-rays of the hip and she also refers having had some joint injections done [facility] [place].  She admits to having had physical therapy off-and-on for the past 5 years [facility] [place]. ? ?The patient's secondary area pain is out of the lower extremities (Bilateral) (L>R).  In the case of the left lower extremity, she refers the pain to go all the way down to her foot where it travels through the lateral aspect of the foot and what appears to be an S1 dermatomal distribution.  She refers occasionally having some pain that will go to the arc of the foot, but this seems to be intermittent and it simply feels as a dull ache.  She also refers having some intermittent numbness of her left foot with some perceived weakness.  In the case of the right lower extremity the pain is limited to the anterior thigh and behind the knee.  She refers that the pain occasionally will go down to the ankle but never gets into the foot.  She denies any numbness but does admit to some perceived weakness.  She refers having had a nerve conduction [facility]   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  The patient indicates having had 2 surgeries on her right foot, one in 2010 and new one in 2012.  She denies any surgeries on her left foot.  She refers having had some x-[facility] of her feet [facility] approximately 6 weeks ago.  The results of these x-rays are not available to me at this time.  The patient denies any recent physical therapy.  However, she does admit to having had some around 2020 and 2021 which did seem to help at that time. ? ?The patient's third area pain is that of her hands (Bilateral) (R>L).  She is right-handed.  In the case of the right hand she has pain on her [facility] as well as her thumb, index finger, and middle finger.  She denies any surgeries, recent x-rays, physical therapy, or any nerve blocks or joint injections.  In the case of the left hand the distribution of the pain is identical, but less intense.  She has also not had any kind of x-rays, surgeries, physical therapy, or nerve blocks in the left side. ? ?The patient's fourth area of pain is that of the knees (Bilateral) (R>L).  The patient describes the pain to be specifically located behind the knee.  She denies any prior knee surgeries but does admit to having  had some recent x-rays of the left knee as well as a left knee injection at [facility].  Unfortunately, the results of these x-rays are not available to me at this time.  She refers that this injection was done approximately 6 weeks ago.  In the case of the right knee she denies any surgeries, joint injections, recent x-rays, or any type of physical therapy. ? ?The patient's fifth area pain is that of the upper back in the thoracic region.  She describes the pain to be in the midline.  This pain is located just below the shoulder blades.  She refers using Voltaren cream for this.  She denies any radicular component to the pain.  No  neck pain, shoulder pain, flank pain, or any pain being referred towards the chest or abdominal region.  She denies any prior surgeries, nerve blocks, physical therapy, or recent x-rays. ? ?The patie

## 2022-01-19 ENCOUNTER — Encounter: Payer: Self-pay | Admitting: Pain Medicine

## 2022-01-19 ENCOUNTER — Ambulatory Visit: Payer: Managed Care, Other (non HMO) | Attending: Pain Medicine | Admitting: Pain Medicine

## 2022-01-19 VITALS — BP 151/94 | HR 89 | Temp 97.1°F | Resp 16 | Ht 68.0 in | Wt 207.0 lb

## 2022-01-19 DIAGNOSIS — G894 Chronic pain syndrome: Secondary | ICD-10-CM

## 2022-01-19 DIAGNOSIS — Z79899 Other long term (current) drug therapy: Secondary | ICD-10-CM | POA: Diagnosis present

## 2022-01-19 DIAGNOSIS — M25561 Pain in right knee: Secondary | ICD-10-CM

## 2022-01-19 DIAGNOSIS — M79641 Pain in right hand: Secondary | ICD-10-CM

## 2022-01-19 DIAGNOSIS — M899 Disorder of bone, unspecified: Secondary | ICD-10-CM | POA: Diagnosis present

## 2022-01-19 DIAGNOSIS — G8929 Other chronic pain: Secondary | ICD-10-CM | POA: Insufficient documentation

## 2022-01-19 DIAGNOSIS — M79642 Pain in left hand: Secondary | ICD-10-CM | POA: Diagnosis present

## 2022-01-19 DIAGNOSIS — M25551 Pain in right hip: Secondary | ICD-10-CM | POA: Insufficient documentation

## 2022-01-19 DIAGNOSIS — M25562 Pain in left knee: Secondary | ICD-10-CM

## 2022-01-19 DIAGNOSIS — Z789 Other specified health status: Secondary | ICD-10-CM | POA: Diagnosis present

## 2022-01-19 DIAGNOSIS — M25552 Pain in left hip: Secondary | ICD-10-CM | POA: Diagnosis present

## 2022-01-19 DIAGNOSIS — M79604 Pain in right leg: Secondary | ICD-10-CM

## 2022-01-19 DIAGNOSIS — M549 Dorsalgia, unspecified: Secondary | ICD-10-CM | POA: Diagnosis present

## 2022-01-19 DIAGNOSIS — M797 Fibromyalgia: Secondary | ICD-10-CM

## 2022-01-19 DIAGNOSIS — M79605 Pain in left leg: Secondary | ICD-10-CM | POA: Diagnosis present

## 2022-01-21 ENCOUNTER — Ambulatory Visit
Admission: RE | Admit: 2022-01-21 | Discharge: 2022-01-21 | Disposition: A | Discharge status: Home / Self Care | Payer: Managed Care, Other (non HMO) | Source: Ambulatory Visit | Attending: Pain Medicine | Admitting: Pain Medicine

## 2022-01-21 ENCOUNTER — Ambulatory Visit
Admission: RE | Admit: 2022-01-21 | Discharge: 2022-01-21 | Disposition: A | Discharge status: Home / Self Care | Payer: Managed Care, Other (non HMO) | Attending: Pain Medicine | Admitting: Pain Medicine

## 2022-01-21 ENCOUNTER — Other Ambulatory Visit: Payer: Self-pay | Admitting: Pain Medicine

## 2022-01-21 DIAGNOSIS — G8929 Other chronic pain: Secondary | ICD-10-CM

## 2022-01-21 DIAGNOSIS — M79642 Pain in left hand: Secondary | ICD-10-CM | POA: Insufficient documentation

## 2022-01-21 DIAGNOSIS — M79605 Pain in left leg: Secondary | ICD-10-CM | POA: Diagnosis present

## 2022-01-21 DIAGNOSIS — M549 Dorsalgia, unspecified: Secondary | ICD-10-CM | POA: Diagnosis present

## 2022-01-21 DIAGNOSIS — M25552 Pain in left hip: Secondary | ICD-10-CM | POA: Insufficient documentation

## 2022-01-21 DIAGNOSIS — M79604 Pain in right leg: Secondary | ICD-10-CM | POA: Insufficient documentation

## 2022-01-21 DIAGNOSIS — M25551 Pain in right hip: Secondary | ICD-10-CM | POA: Insufficient documentation

## 2022-01-21 DIAGNOSIS — M25561 Pain in right knee: Secondary | ICD-10-CM | POA: Diagnosis present

## 2022-01-21 DIAGNOSIS — M79641 Pain in right hand: Secondary | ICD-10-CM | POA: Insufficient documentation

## 2022-01-21 DIAGNOSIS — M25562 Pain in left knee: Secondary | ICD-10-CM | POA: Insufficient documentation

## 2022-01-25 LAB — COMP. METABOLIC PANEL (12)
AST: 22 IU/L (ref 0–40)
Albumin/Globulin Ratio: 2.1 (ref 1.2–2.2)
Albumin: 4.8 g/dL (ref 3.8–4.9)
Alkaline Phosphatase: 79 IU/L (ref 44–121)
BUN/Creatinine Ratio: 14 (ref 9–23)
BUN: 11 mg/dL (ref 6–24)
Bilirubin Total: 0.3 mg/dL (ref 0.0–1.2)
Calcium: 10 mg/dL (ref 8.7–10.2)
Chloride: 104 mmol/L (ref 96–106)
Creatinine, Ser: 0.76 mg/dL (ref 0.57–1.00)
Globulin, Total: 2.3 g/dL (ref 1.5–4.5)
Glucose: 84 mg/dL (ref 70–99)
Potassium: 4.8 mmol/L (ref 3.5–5.2)
Sodium: 144 mmol/L (ref 134–144)
Total Protein: 7.1 g/dL (ref 6.0–8.5)
eGFR: 92 mL/min/{1.73_m2} (ref >59)

## 2022-01-25 LAB — MAGNESIUM: Magnesium: 2.4 mg/dL — ABNORMAL HIGH (ref 1.6–2.3)

## 2022-01-25 LAB — 25-HYDROXY VITAMIN D LCMS D2+D3
25-Hydroxy, Vitamin D-2: 6.1 ng/mL
25-Hydroxy, Vitamin D-3: 17 ng/mL
25-Hydroxy, Vitamin D: 23 ng/mL — ABNORMAL LOW

## 2022-01-25 LAB — VITAMIN B12: Vitamin B-12: >2000 pg/mL — ABNORMAL HIGH (ref 232–1245)

## 2022-01-25 LAB — COMPLIANCE DRUG ANALYSIS, UR

## 2022-01-25 LAB — SEDIMENTATION RATE: Sed Rate: 26 mm/hr (ref 0–40)

## 2022-01-25 LAB — C-REACTIVE PROTEIN: CRP: 1 mg/L (ref 0–10)

## 2022-02-13 NOTE — Progress Notes (Signed)
PROVIDER NOTE: Information contained herein reflects review and annotations entered in association with encounter. Interpretation of such information and data should be left to medically-trained personnel. Information provided to patient can be located elsewhere in the medical record under "Patient Instructions". Document created using STT-dictation technology, any transcriptional errors that may result from process are unintentional.  ?  ?Patient: Donna Liu  Service Category: E/M  Provider: Oswaldo DoneFrancisco A Kinga Cassar, MD  ?DOB: Nov 14, 1965  DOS: 02/14/2022  Specialty: Interventional Pain Management  ?MRN: [id number]  Setting: Ambulatory outpatient  PCP: Wilford CornerWhitaker, Jason Hestle, PA-C  ?Type: Established Patient    Referring Provider: Lorre MunroeBaity, Regina W, NP  ?Location: Office  Delivery: Face-to-face    ? ?Primary Reason(s) for Visit: Encounter for evaluation before starting new chronic pain management plan of care (Level of risk: moderate) ?CC: Generalized Body Aches (Fibromyalgia ), Leg Pain (Bilateral ), and crps  ? ?HPI  ?Donna Liu is a 56 y.o. year old, female patient, who comes today for a follow-up evaluation to review the test results and decide on a treatment plan. She has Essential hypertension; Memory change; Fibromyalgia (6th area of Pain); Anxiety and depression; PTSD (post-traumatic stress disorder); Mixed hyperlipidemia; Complex regional pain syndrome type 1 of right lower extremity; History of DVT (deep vein thrombosis); Tremor; Chronic hip pain (1ry area of Pain) (Bilateral) (R>L); Neuropathy; Sleep disorder; Trochanteric bursitis of right hip; Chronic pain syndrome; Pharmacologic therapy; Disorder of skeletal system; Problems influencing health status; Chronic lower extremity pain (2ry area of Pain) (Bilateral) (L>R); Chronic hand pain (3ry area of Pain) (Bilateral) (R>L); Chronic knee pain (4th area of Pain) (Bilateral) (R>L); Chronic upper back pain (5th area of Pain) (Midline); and Vitamin D  insufficiency on their problem list. Her primarily concern today is the Generalized Body Aches (Fibromyalgia ), Leg Pain (Bilateral ), and crps  ? ?Pain Assessment: ?Location: Other (Comment) (BILATERAL LEGS) Other (Comment) (SEE VISIT INFO FOR ALL PAIN SITES) ?Radiating:   ?Onset: More than a month ago ?Duration: Chronic pain ?Quality: Discomfort, Constant, Dull, Stabbing, Throbbing ?Severity: 5 /10 (subjective, self-reported pain score)  ?Effect on ADL: sometimes when walking she becomes weak ?Timing: Constant ?Modifying factors: walking, heat, hot tub, topical analgesics ?BP: (!) 139/98  HR: 93 ? ?Donna Liu comes in today for a follow-up visit after her initial evaluation on 01/19/2022. Today we went over the results of her tests. These were explained in "Layman's terms". During today's appointment we went over my diagnostic impression, as well as the proposed treatment plan. ? ?Review of initial evaluation (01/19/2022): "According to the patient, she came in for an evaluation around 2011.  That is prior to this switch to the epic EMR and therefore it is not immediately available.  The patient indicates that at the time I was treating her for lower extremity CRPS and she thinks that she had approximately 9 different lumbar sympathetic block treatments.  After that, she moved her care to [facility] where they started treating her with methadone 5 mg p.o. at bedtime and they proposed a possible spinal cord stimulator trial/implant.  The patient indicates that during the course of her treatment her physician wanted to increase her methadone to 30 mg/day but she had a disagreement with them due to the fact that the 5 mg were working and she did not feel that she needed to have that increase.  She refers that because of that disagreement, she was discharged from the practice due to "noncompliance".  Later on she went on  to [facility] where she was being treated with Vicodin 5/325 4 times  daily, but again she refers that she was dropped when they attempted to increase the dose and she did not agree with that.  She indicates that again she was dropped due to "failure to comply". ?  ?According to the patient the primary area of pain is that of the hips (Bilateral) (R>L). The patient indicates having had surgery on her right hip, while she was living in [place].  She refers that this surgery was an exploratory surgery and they found her to have some tears as well as some muscle problems for which she indicates she had a "muscle transfer".  Further evaluation reveals that the patient had a "gluteal tendon repair".  She indicates having had some fairly recent x-rays of the hip and she also refers having had some joint injections done at the [facility] clinic in [place].  She admits to having had physical therapy off-and-on for the past 5 years at [facility] in [place]. ?  ?The patient's secondary area pain is out of the lower extremities (Bilateral) (L>R).  In the case of the left lower extremity, she refers the pain to go all the way down to her foot where it travels through the lateral aspect of the foot and what appears to be an S1 dermatomal distribution.  She refers occasionally having some pain that will go to the arc of the foot, but this seems to be intermittent and it simply feels as a dull ache.  She also refers having some intermittent numbness of her left foot with some perceived weakness.  In the case of the right lower extremity the pain is limited to the anterior thigh and behind the knee.  She refers that the pain occasionally will go down to the ankle but never gets into the foot.  She denies any numbness but does admit to some perceived weakness.  She refers having had a nerve conduction test done by Dr. Malvin Johns [facility]).  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  The patient indicates  having had 2 surgeries on her right foot, one in 2010 and new one in 2012.  She denies any surgeries on her left foot.  She refers having had some x-rays of her feet done at [facility] approximately 6 weeks ago.  The results of these x-rays are not available to me at this time.  The patient denies any recent physical therapy.  However, she does admit to having had some around 2020 and 2021 which did seem to help at that time. ?  ?The patient's third area pain is that of her hands (Bilateral) (R>L).  She is right-handed.  In the case of the right hand she has pain on her first [facility] as well as her thumb, index finger, and middle finger.  She denies any surgeries, recent x-rays, physical therapy, or any nerve blocks or joint injections.  In the case of the left hand the distribution of the pain is identical, but less intense.  She has also not had any kind of x-rays, surgeries, physical therapy, or nerve blocks in the left side. ?  ?The patient's fourth area of pain is that of the knees (Bilateral) (R>L).  The patient describes the pain to be specifically located behind the knee.  She denies any prior knee surgeries but does admit to having had some recent x-rays of the  left knee as well as a left knee injection at [facility].  Unfortunately, the results of these x-rays are not available to me at this time.  She refers that this injection was done approximately 6 weeks ago.  In the case of the right knee she denies any surgeries, joint injections, recent x-rays, or any type of physical therapy. ?  ?The patient's fifth area pain is that of the upper back in the thoracic region.  She describes the pain to be in the midline.  This pain is located just below the shoulder blades.  She refers using Voltaren cream for this.  She denies any radicular component to the pain.  No neck pain, shoulder pain, flank pain, or any pain being referred towards the chest or abdominal region.  She denies any prior surgeries, nerve blocks,  physical therapy, or recent x-rays. ?  ?The patient is sixth area of pain is that of her fibromyalgia and generalized pains.  She describes having had this diagnosed by her primary care physician in [place], who i

## 2022-02-14 ENCOUNTER — Other Ambulatory Visit: Payer: Self-pay

## 2022-02-14 ENCOUNTER — Ambulatory Visit: Payer: Managed Care, Other (non HMO) | Attending: Pain Medicine | Admitting: Pain Medicine

## 2022-02-14 ENCOUNTER — Encounter: Payer: Self-pay | Admitting: Pain Medicine

## 2022-02-14 VITALS — BP 139/98 | HR 93 | Temp 97.9°F | Resp 16 | Ht 68.0 in | Wt 203.0 lb

## 2022-02-14 DIAGNOSIS — M25551 Pain in right hip: Secondary | ICD-10-CM | POA: Diagnosis not present

## 2022-02-14 DIAGNOSIS — M79641 Pain in right hand: Secondary | ICD-10-CM | POA: Insufficient documentation

## 2022-02-14 DIAGNOSIS — M549 Dorsalgia, unspecified: Secondary | ICD-10-CM | POA: Diagnosis present

## 2022-02-14 DIAGNOSIS — G8929 Other chronic pain: Secondary | ICD-10-CM | POA: Insufficient documentation

## 2022-02-14 DIAGNOSIS — M79642 Pain in left hand: Secondary | ICD-10-CM | POA: Insufficient documentation

## 2022-02-14 DIAGNOSIS — M25552 Pain in left hip: Secondary | ICD-10-CM | POA: Insufficient documentation

## 2022-02-14 DIAGNOSIS — M79605 Pain in left leg: Secondary | ICD-10-CM | POA: Diagnosis present

## 2022-02-14 DIAGNOSIS — M79604 Pain in right leg: Secondary | ICD-10-CM | POA: Insufficient documentation

## 2022-02-14 DIAGNOSIS — M25562 Pain in left knee: Secondary | ICD-10-CM | POA: Insufficient documentation

## 2022-02-14 DIAGNOSIS — M25561 Pain in right knee: Secondary | ICD-10-CM | POA: Insufficient documentation

## 2022-02-14 DIAGNOSIS — G894 Chronic pain syndrome: Secondary | ICD-10-CM | POA: Insufficient documentation

## 2022-02-14 DIAGNOSIS — E559 Vitamin D deficiency, unspecified: Secondary | ICD-10-CM | POA: Insufficient documentation

## 2022-02-14 NOTE — Progress Notes (Signed)
Safety precautions to be maintained throughout the outpatient stay will include: orient to surroundings, keep bed in low position, maintain call bell within reach at all times, provide assistance with transfer out of bed and ambulation.  

## 2022-02-14 NOTE — Patient Instructions (Signed)
____________________________________________________________________________________________ ? ?Pain Prevention Technique ? ?Definition:   ?A technique used to minimize the effects of an activity known to cause inflammation or swelling, which in turn leads to an increase in pain. ? ?Purpose: ?To prevent swelling from occurring. It is based on the fact that it is easier to prevent swelling from happening than it is to get rid of it, once it occurs. ? ?Contraindications: ?Anyone with allergy or hypersensitivity to the recommended medications. ?Anyone taking anticoagulants (Blood Thinners) (e.g., Coumadin, Warfarin, Plavix, etc.). ?Patients in Renal Failure. ? ?Technique: ?Before you undertake an activity known to cause pain, or a flare-up of your chronic pain, and before you experience any pain, do the following: ? ?On a full stomach, take 4 (four) over the counter Ibuprofens 200mg  tablets (Motrin), for a total of 800 mg. ?In addition, take over the counter Magnesium 400 to 500 mg, before doing the activity.  ?Six (6) hours later, again on a full stomach, repeat the Ibuprofen. ?That night, take a warm shower and stretch under the running warm water. ? ?This technique may be sufficient to abort the pain and discomfort before it happens. Keep in mind that it takes a lot less medication to prevent swelling than it takes to eliminate it once it occurs.  ?____________________________________________________________________________________________ ? ______________________________________________________________________ ? ?Preparing for your procedure (without sedation) ? ?Procedure appointments are limited to planned procedures: ?No Prescription Refills. ?No disability issues will be discussed. ?No medication changes will be discussed. ? ?Instructions: ?Oral Intake: Do not eat or drink anything for at least 6 hours prior to your procedure. (Exception: Blood Pressure Medication. See below.) ?Transportation: Unless otherwise  stated by your physician, you may drive yourself after the procedure. ?Blood Pressure Medicine: Do not forget to take your blood pressure medicine with a sip of water the morning of the procedure. If your Diastolic (lower reading)is above 100 mmHg, elective cases will be cancelled/rescheduled. ?Blood thinners: These will need to be stopped for procedures. Notify our staff if you are taking any blood thinners. Depending on which one you take, there will be specific instructions on how and when to stop it. ?Diabetics on insulin: Notify the staff so that you can be scheduled 1st case in the morning. If your diabetes requires high dose insulin, take only ? of your normal insulin dose the morning of the procedure and notify the staff that you have done so. ?Preventing infections: Shower with an antibacterial soap the morning of your procedure.  ?Build-up your immune system: Take 1000 mg of Vitamin C with every meal (3 times a day) the day prior to your procedure. ?Antibiotics: Inform the staff if you have a condition or reason that requires you to take antibiotics before dental procedures. ?Pregnancy: If you are pregnant, call and cancel the procedure. ?Sickness: If you have a cold, fever, or any active infections, call and cancel the procedure. ?Arrival: You must be in the facility at least 30 minutes prior to your scheduled procedure. ?Children: Do not bring any children with you. ?Dress appropriately: Bring dark clothing that you would not mind if they get stained. ?Valuables: Do not bring any jewelry or valuables. ? ?Reasons to call and reschedule or cancel your procedure: (Following these recommendations will minimize the risk of a serious complication.) ?Surgeries: Avoid having procedures within 2 weeks of any surgery. (Avoid for 2 weeks before or after any surgery). ?Flu Shots: Avoid having procedures within 2 weeks of a flu shots or . (Avoid for 2 weeks before or after  immunizations). ?Barium: Avoid having a  procedure within 7-10 days after having had a radiological study involving the use of radiological contrast. (Myelograms, Barium swallow or enema study). ?Heart attacks: Avoid any elective procedures or surgeries for the initial 6 months after a "Myocardial Infarction" (Heart Attack). ?Blood thinners: It is imperative that you stop these medications before procedures. Let us know if you if you take any blood thinner.  ?Infection: Avoid procedures during or within two weeks of an infection (including chest colds or gastrointestinal problems). Symptoms associated with infections include: Localized redness, fever, chills, night sweats or profuse sweating, burning sensation when voiding, cough, congestion, stuffiness, runny nose, sore throat, diarrhea, nausea, vomiting, cold or Flu symptoms, recent or current infections. It is specially important if the infection is over the area that we intend to treat. ?Heart and lung problems: Symptoms that may suggest an active cardiopulmonary problem include: cough, chest pain, breathing difficulties or shortness of breath, dizziness, ankle swelling, uncontrolled high or unusually low blood pressure, and/or palpitations. If you are experiencing any of these symptoms, cancel your procedure and contact your primary care physician for an evaluation. ? ?Remember:  ?Regular Business hours are:  ?Monday to Thursday 8:00 AM to 4:00 PM ? ?Provider's Schedule: ?Delano MetzFrancisco Makana Rostad, MD:  ?Procedure days: Tuesday and Thursday 7:30 AM to 4:00 PM ? ?Edward JollyBilal Lateef, MD:  ?Procedure days: Monday and Wednesday 7:30 AM to 4:00 PM ?______________________________________________________________________ ? ____________________________________________________________________________________________ ? ?General Risks and Possible Complications ? ?Patient Responsibilities: It is important that you read this as it is part of your informed consent. It is our duty to inform you of the risks and possible  complications associated with treatments offered to you. It is your responsibility as a patient to read this and to ask questions about anything that is not clear or that you believe was not covered in this document. ? ?Patient?s Rights: You have the right to refuse treatment. You also have the right to change your mind, even after initially having agreed to have the treatment done. However, under this last option, if you wait until the last second to change your mind, you may be charged for the materials used up to that point. ? ?Introduction: Medicine is not an Visual merchandiserexact science. Everything in Medicine, including the lack of treatment(s), carries the potential for danger, harm, or loss (which is by definition: Risk). In Medicine, a complication is a secondary problem, condition, or disease that can aggravate an already existing one. All treatments carry the risk of possible complications. The fact that a side effects or complications occurs, does not imply that the treatment was conducted incorrectly. It must be clearly understood that these can happen even when everything is done following the highest safety standards. ? ?No treatment: You can choose not to proceed with the proposed treatment alternative. The ?PRO(s)? would include: avoiding the risk of complications associated with the therapy. The ?CON(s)? would include: not getting any of the treatment benefits. These benefits fall under one of three categories: diagnostic; therapeutic; and/or palliative. Diagnostic benefits include: getting information which can ultimately lead to improvement of the disease or symptom(s). Therapeutic benefits are those associated with the successful treatment of the disease. Finally, palliative benefits are those related to the decrease of the primary symptoms, without necessarily curing the condition (example: decreasing the pain from a flare-up of a chronic condition, such as incurable terminal cancer). ? ?General Risks and  Complications: These are associated to most interventional treatments. They can occur alone, or in combination.  They fall under one of the following six (6) categories: no benefit or worsening of symptoms; bleeding;

## 2022-03-14 NOTE — Progress Notes (Unsigned)
PROVIDER NOTE: Interpretation of information contained herein should be left to medically-trained personnel. Specific patient instructions are provided elsewhere under "Patient Instructions" section of medical record. This document was created in part using STT-dictation technology, any transcriptional errors that may result from this process are unintentional.  Patient: Donna Liu Type: Established DOB: February 09, 1966 MRN: [id number] PCP: Wilford Corner, PA-C  Service: Procedure DOS: 03/15/2022 Setting: Ambulatory Location: Ambulatory outpatient facility Delivery: Face-to-face Provider: Oswaldo Done, MD Specialty: Interventional Pain Management Specialty designation: 09 Location: Outpatient facility Ref. Prov.: Debbra Riding Hestle,*    Primary Reason for Visit: Interventional Pain Management Treatment. CC: No chief complaint on file.    Procedure #1:  Anesthesia, Analgesia, Anxiolysis:  Type: Intra-Articular Hip Injection #1  Primary Purpose: Diagnostic Region: Posterolateral hip joint area. Level: Lower pelvic and hip joint level. Target Area: Superior aspect of the hip joint cavity, going thru the superior portion of the capsular ligament. Approach: Posterolateral approach. Laterality: Bilateral  Anesthesia: Local (1-2% Lidocaine)  Anxiolysis: None  Sedation: None  Guidance: Fluoroscopy           Position: Prone Area Prepped: Entire Posterolateral hip area. DuraPrep (Iodine Povacrylex [0.7% available iodine] and Isopropyl Alcohol, 74% w/w)   Procedure #2:    Type: Bilateral Trochanteric Bursa & right iliopsoas bursa injection #1  Primary Purpose: Diagnostic Region: Upper (proximal) Femoral Region Level: Hip Joint Target Area: Superior aspect of the hip joint cavity, going thru the superior portion of the capsular ligament. Approach: Posterolateral approach Laterality: Bilateral     1. Chronic hip pain (1ry area of Pain) (Bilateral) (R>L)   2.  Enthesopathy of hip region (Bilateral)   3. Trochanteric bursitis of hips (Bilateral)    NAS-11 Pain score:   Pre-procedure: 7 /10   Post-procedure: 7 /10     Pre-op H&P Assessment:  Donna Liu is a 56 y.o. (year old), female patient, seen today for interventional treatment. She  has a past surgical history that includes Tonsillectomy; Cholecystectomy; Abdominal hysterectomy; Foot surgery (Right); Gastric bypass; and Appendectomy. Donna Liu has a current medication list which includes the following prescription(s): amlodipine, advil pm, olmesartan, sertraline, tramadol, vitamin d (ergocalciferol), and wegovy. Her primarily concern today is the No chief complaint on file.  Initial Vital Signs:  Pulse/HCG Rate: 94  Temp: 98.6 F (37 C) Resp: 16 BP: (!) 141/83 SpO2: 99 %  BMI: Estimated body mass index is 29.8 kg/m as calculated from the following:   Height as of this encounter: 5\' 8"  (1.727 m).   Weight as of this encounter: 196 lb (88.9 kg).  Risk Assessment: Allergies: Reviewed. She is allergic to penicillins, tapentadol, and sulfa antibiotics.  Allergy Precautions: None required Coagulopathies: Reviewed. None identified.  Blood-thinner therapy: None at this time Active Infection(s): Reviewed. None identified. Donna Liu is afebrile  Site Confirmation: Donna Liu was asked to confirm the procedure and laterality before marking the site Procedure checklist: Completed Consent: Before the procedure and under the influence of no sedative(s), amnesic(s), or anxiolytics, the patient was informed of the treatment options, risks and possible complications. To fulfill our ethical and legal obligations, as recommended by the American Medical Association's Code of Ethics, I have informed the patient of my clinical impression; the nature and purpose of the treatment or procedure; the risks, benefits, and possible complications of the intervention; the alternatives, including  doing nothing; the risk(s) and benefit(s) of the alternative treatment(s) or procedure(s); and the risk(s) and benefit(s) of doing nothing. The patient was provided  information about the general risks and possible complications associated with the procedure. These may include, but are not limited to: failure to achieve desired goals, infection, bleeding, organ or nerve damage, allergic reactions, paralysis, and death. In addition, the patient was informed of those risks and complications associated to the procedure, such as failure to decrease pain; infection; bleeding; organ or nerve damage with subsequent damage to sensory, motor, and/or autonomic systems, resulting in permanent pain, numbness, and/or weakness of one or several areas of the body; allergic reactions; (i.e.: anaphylactic reaction); and/or death. Furthermore, the patient was informed of those risks and complications associated with the medications. These include, but are not limited to: allergic reactions (i.e.: anaphylactic or anaphylactoid reaction(s)); adrenal axis suppression; blood sugar elevation that in diabetics may result in ketoacidosis or comma; water retention that in patients with history of congestive heart failure may result in shortness of breath, pulmonary edema, and decompensation with resultant heart failure; weight gain; swelling or edema; medication-induced neural toxicity; particulate matter embolism and blood vessel occlusion with resultant organ, and/or nervous system infarction; and/or aseptic necrosis of one or more joints. Finally, the patient was informed that Medicine is not an exact science; therefore, there is also the possibility of unforeseen or unpredictable risks and/or possible complications that may result in a catastrophic outcome. The patient indicated having understood very clearly. We have given the patient no guarantees and we have made no promises. Enough time was given to the patient to ask questions,  all of which were answered to the patient's satisfaction. Donna Liu has indicated that she wanted to continue with the procedure. Attestation: I, the ordering provider, attest that I have discussed with the patient the benefits, risks, side-effects, alternatives, likelihood of achieving goals, and potential problems during recovery for the procedure that I have provided informed consent. Date  Time: 03/15/2022  9:24 AM  Pre-Procedure Preparation:  Monitoring: As per clinic protocol. Respiration, ETCO2, SpO2, BP, heart rate and rhythm monitor placed and checked for adequate function Safety Precautions: Patient was assessed for positional comfort and pressure points before starting the procedure. Time-out: I initiated and conducted the "Time-out" before starting the procedure, as per protocol. The patient was asked to participate by confirming the accuracy of the "Time Out" information. Verification of the correct person, site, and procedure were performed and confirmed by me, the nursing staff, and the patient. "Time-out" conducted as per Joint Commission's Universal Protocol (UP.01.01.01). Time: 1031  Description of Procedure #1:  Safety Precautions: Aspiration looking for blood return was conducted prior to all injections. At no point did we inject any substances, as a needle was being advanced. No attempts were made at seeking any paresthesias. Safe injection practices and needle disposal techniques used. Medications properly checked for expiration dates. SDV (single dose vial) medications used. Description of the Procedure: Protocol guidelines were followed. The patient was placed in position over the fluoroscopy table. The target area was identified and the area prepped in the usual manner. Skin & deeper tissues infiltrated with local anesthetic. Appropriate amount of time allowed to pass for local anesthetics to take effect. The procedure needles were then advanced to the target area. Proper  needle placement secured. Negative aspiration confirmed. Solution injected in intermittent fashion, asking for systemic symptoms every 0.5cc of injectate. The needles were then removed and the area cleansed, making sure to leave some of the prepping solution back to take advantage of its long term bactericidal properties.  Start Time: 1031 hrs. Materials:  Needle(s) Type:  Spinal Needle Gauge: 22G Length: 5.0-in Medication(s): Please see orders for medications and dosing details.  Imaging Guidance (Non-Spinal):          Type of Imaging Technique: Fluoroscopy Guidance (Non-Spinal) Indication(s): Assistance in needle guidance and placement for procedures requiring needle placement in or near specific anatomical locations not easily accessible without such assistance. Exposure Time: Please see nurses notes. Contrast: Before injecting any contrast, we confirmed that the patient did not have an allergy to iodine, shellfish, or radiological contrast. Once satisfactory needle placement was completed at the desired level, radiological contrast was injected. Contrast injected under live fluoroscopy. No contrast complications. See chart for type and volume of contrast used. Fluoroscopic Guidance: I was personally present during the use of fluoroscopy. "Tunnel Vision Technique" used to obtain the best possible view of the target area. Parallax error corrected before commencing the procedure. "Direction-depth-direction" technique used to introduce the needle under continuous pulsed fluoroscopy. Once target was reached, antero-posterior, oblique, and lateral fluoroscopic projection used confirm needle placement in all planes. Images permanently stored in EMR. Interpretation: I personally interpreted the imaging intraoperatively. Adequate needle placement confirmed in multiple planes. Appropriate spread of contrast into desired area was observed. No evidence of afferent or efferent intravascular uptake. Permanent  images saved into the patient's record.   Description of Procedure #2:  Description of the Procedure: Skin & deeper tissues infiltrated with local anesthetic. Appropriate amount of time allowed to pass for local anesthetics to take effect. The procedure needles were then advanced to the target area. Proper needle placement secured. Negative aspiration confirmed. Solution injected in intermittent fashion, asking for systemic symptoms every 0.5cc of injectate. The needles were then removed and the area cleansed, making sure to leave some of the prepping solution back to take advantage of its long term bactericidal properties.  Vitals:   03/15/22 1025 03/15/22 1030 03/15/22 1035 03/15/22 1040  BP: (!) 144/84 139/87 (!) 141/86 (!) 134/94  Pulse: 85 83 84 87  Resp: 18 20 15 15   Temp:      TempSrc:      SpO2: 98% 97% 96% 97%  Weight:      Height:         End Time: 1042 hrs.         Materials:  Needle(s) Type: Spinal Needle Gauge: 22G Length: 5.0-in Medication(s): Please see orders for medications and dosing details.  Imaging Guidance (Non-Spinal):          Type of Imaging Technique: Fluoroscopy Guidance (Non-Spinal) Indication(s): Assistance in needle guidance and placement for procedures requiring needle placement in or near specific anatomical locations not easily accessible without such assistance. Exposure Time: Please see nurses notes. Contrast: Before injecting any contrast, we confirmed that the patient did not have an allergy to iodine, shellfish, or radiological contrast. Once satisfactory needle placement was completed at the desired level, radiological contrast was injected. Contrast injected under live fluoroscopy. No contrast complications. See chart for type and volume of contrast used. Fluoroscopic Guidance: I was personally present during the use of fluoroscopy. "Tunnel Vision Technique" used to obtain the best possible view of the target area. Parallax error corrected  before commencing the procedure. "Direction-depth-direction" technique used to introduce the needle under continuous pulsed fluoroscopy. Once target was reached, antero-posterior, oblique, and lateral fluoroscopic projection used confirm needle placement in all planes. Images permanently stored in EMR. Interpretation: I personally interpreted the imaging intraoperatively. Adequate needle placement confirmed in multiple planes. Appropriate spread of contrast into desired area was observed.  No evidence of afferent or efferent intravascular uptake. Permanent images saved into the patient's record.  Antibiotic Prophylaxis:   Anti-infectives (From admission, onward)    None      Indication(s): None identified  Post-operative Assessment:  Post-procedure Vital Signs:  Pulse/HCG Rate: 87  Temp: 98.6 F (37 C) Resp: 15 BP: (!) 134/94 SpO2: 97 %  EBL: None  Complications: No immediate post-treatment complications observed by team, or reported by patient.  Note: The patient tolerated the entire procedure well. A repeat set of vitals were taken after the procedure and the patient was kept under observation following institutional policy, for this type of procedure. Post-procedural neurological assessment was performed, showing return to baseline, prior to discharge. The patient was provided with post-procedure discharge instructions, including a section on how to identify potential problems. Should any problems arise concerning this procedure, the patient was given instructions to immediately contact us, at any time, without hesitation. In any case, we plan to contact the patient by telephone for a follow-up status report regarding this interventional procedure.  Comments:  No additional relevant information.  Plan of Care  Orders:  Orders Placed This Encounter  Procedures   HIP INJECTION    Scheduling Instructions:     Side: Bilateral     Sedation: Patient's choice.     Timeframe: Today    DG PA[facility] C-ARM 1-60 MIN NO REPORT    Intraoperative interpretation by procedural physician at [facility].    Standing Status:   Standing    Number of Occurrences:   1    Order Specific Question:   Reason for exam:    Answer:   Assistance in needle guidance and placement for procedures requiring needle placement in or near specific anatomical locations not easily accessible without such assistance.   Informed Consent Details: Physician/Practitioner Attestation; Transcribe to consent form and obtain patient signature    Nursing Order: Transcribe to consent form and obtain patient signature. Note: Always confirm laterality of pain with Donna Liu, before procedure.    Order Specific Question:   Physician/Practitioner attestation of informed consent for procedure/surgical case    Answer:   I, the physician/practitioner, attest that I have discussed with the patient the benefits, risks, side effects, alternatives, likelihood of achieving goals and potential problems during recovery for the procedure that I have provided informed consent.    Order Specific Question:   Procedure    Answer:   Hip injection    Order Specific Question:   Physician/Practitioner performing the procedure    Answer:   Matthias Bogus A. Laban EmperorNaveira, MD    Order Specific Question:   Indication/Reason    Answer:   Hip Joint Pain (Arthralgia)   Provide equipment / supplies at bedside    "Block Tray" (Disposable  single use) Needle type: SpinalSpinal Amount/quantity: 2 Size: Medium (5-inch) Gauge: 22G    Standing Status:   Standing    Number of Occurrences:   1    Order Specific Question:   Specify    Answer:   Block Tray   Chronic Opioid Analgesic:  Tramadol 50 mg tablet, 1 tab p.o. 4 times daily (# 20) (last filled on 12/07/2021) (prescribed by Levi AlandJames Ronald Bowers, MD) MME/day: 20 mg/day   Medications ordered for procedure: Meds ordered this encounter  Medications   iohexol (OMNIPAQUE) 180 MG/ML  injection 10 mL    Must be Myelogram-compatible. If not available, you may substitute with a water-soluble, non-ionic, hypoallergenic, myelogram-compatible radiological contrast medium.  lidocaine (XYLOCAINE) 2 % (with pres) injection 400 mg   pentafluoroprop-tetrafluoroeth (GEBAUERS) aerosol   DISCONTD: lactated ringers infusion 1,000 mL   DISCONTD: midazolam (VERSED) 5 MG/5ML injection 0.5-2 mg    Make sure Flumazenil is available in the pyxis when using this medication. If oversedation occurs, administer 0.2 mg IV over 15 sec. If after 45 sec no response, administer 0.2 mg again over 1 min; may repeat at 1 min intervals; not to exceed 4 doses (1 mg)   ropivacaine (PF) 2 mg/mL (0.2%) (NAROPIN) injection 18 mL   methylPREDNISolone acetate (DEPO-MEDROL) injection 160 mg   Medications administered: We administered iohexol, lidocaine, pentafluoroprop-tetrafluoroeth, ropivacaine (PF) 2 mg/mL (0.2%), and methylPREDNISolone acetate.  See the medical record for exact dosing, route, and time of administration.  Follow-up plan:   Return in about 2 weeks (around 03/29/2022) for Proc-day (T,Th), (F2F), (PPE).       Interventional Therapies  Risk  Complexity Considerations:   Estimated body mass index is 31.47 kg/m as calculated from the following:   Height as of this encounter:  (1.727 m).   Weight as of this encounter: 207 lb (93.9 kg). WNL   Planned  Pending:   Diagnostic/therapeutic bilateral IA knee hip joint inj. #1   Under consideration:   Diagnostic/therapeutic IA hip joint inj. #1   Diagnostic left L5-S1 LESI  Diagnostic/therapeutic bilateral hand joint inj.  Diagnostic/therapeutic bilateral knee joint inj.  Diagnostic/therapeutic midline thoracic spine inj.    Completed:   Diagnostic/therapeutic right (L3) lumbar sympathetic Blk (03/04/2010) (75-100/75/75)    Completed by other providers:   Therapeutic right hip joint inj. 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 x1 (05/08/2019) by Dorthula Nettles, DO  (06/20/2013) [facility] ([facility]) Tamala Fothergill, MD (Lidocaine infusions x 4)    Therapeutic  Palliative (PRN) options:   None established    Recent Visits Date Type Provider Dept  02/14/22 Office Visit Delano Metz, MD [facility]  01/19/22 Office Visit Delano Metz, MD [facility]  Showing recent visits within past 90 days and meeting all other requirements Today's Visits Date Type Provider Dept  03/15/22 Procedure visit Delano Metz, MD [facility]  Showing today's visits and meeting all other requirements Future Appointments Date Type Provider Dept  03/29/22 Appointment Delano Metz, MD [facility]  Showing future appointments within next 90 days and meeting all other requirements  Disposition: Discharge home  Discharge (Date  Time): 03/15/2022; 1049 hrs.   Primary Care Physician: Wilford Corner, PA-C Location: [facility] Note by: Oswaldo Done, MD Date: 03/15/2022; Time: 12:45 PM  Disclaimer:  Medicine is not an Visual merchandiser. The only guarantee in medicine is that nothing is guaranteed. It is important to note that the decision to proceed with this intervention was based on the information collected from the patient. The Data and conclusions were drawn from the patient's questionnaire, the interview, and the physical examination. Because the information was provided in large part by the patient, it cannot be guaranteed that it has not been purposely or unconsciously manipulated. Every effort has been made to obtain as much relevant data as possible for this evaluation. It is important to note that the conclusions that lead to this  procedure are derived in large part from the available data. Always take into account that the treatment will  also be dependent on availability of resources and existing treatment guidelines, considered by other Pain Management Practitioners as being common knowledge and practice, at the time of the intervention. For Medico-Legal purposes, it is also important to point out that variation in procedural techniques and pharmacological choices are the acceptable norm. The indications, contraindications, technique, and results of the above procedure should only be interpreted and judged by a Board-Certified Interventional Pain Specialist with extensive familiarity and expertise in the same exact procedure and technique.

## 2022-03-15 ENCOUNTER — Encounter: Payer: Self-pay | Admitting: Pain Medicine

## 2022-03-15 ENCOUNTER — Ambulatory Visit
Admission: RE | Admit: 2022-03-15 | Discharge: 2022-03-15 | Disposition: A | Discharge status: Home / Self Care | Payer: Managed Care, Other (non HMO) | Source: Ambulatory Visit | Attending: Pain Medicine | Admitting: Pain Medicine

## 2022-03-15 ENCOUNTER — Ambulatory Visit: Payer: Managed Care, Other (non HMO) | Attending: Pain Medicine | Admitting: Pain Medicine

## 2022-03-15 VITALS — BP 134/94 | HR 87 | Temp 98.6°F | Resp 15 | Ht 68.0 in | Wt 196.0 lb

## 2022-03-15 DIAGNOSIS — M76892 Other specified enthesopathies of left lower limb, excluding foot: Secondary | ICD-10-CM | POA: Diagnosis present

## 2022-03-15 DIAGNOSIS — M25551 Pain in right hip: Secondary | ICD-10-CM | POA: Diagnosis present

## 2022-03-15 DIAGNOSIS — M7061 Trochanteric bursitis, right hip: Secondary | ICD-10-CM | POA: Insufficient documentation

## 2022-03-15 DIAGNOSIS — M7062 Trochanteric bursitis, left hip: Secondary | ICD-10-CM | POA: Diagnosis present

## 2022-03-15 DIAGNOSIS — M7071 Other bursitis of hip, right hip: Secondary | ICD-10-CM | POA: Diagnosis present

## 2022-03-15 DIAGNOSIS — M25552 Pain in left hip: Secondary | ICD-10-CM | POA: Insufficient documentation

## 2022-03-15 DIAGNOSIS — G8929 Other chronic pain: Secondary | ICD-10-CM | POA: Diagnosis present

## 2022-03-15 DIAGNOSIS — M76891 Other specified enthesopathies of right lower limb, excluding foot: Secondary | ICD-10-CM | POA: Diagnosis present

## 2022-03-15 MED ORDER — PENTAFLUOROPROP-TETRAFLUOROETH EX AERO: INHALATION_SPRAY | Freq: Once | CUTANEOUS | Status: AC

## 2022-03-15 MED ORDER — LIDOCAINE HCL 2 % IJ SOLN
20.0000 mL | Freq: Once | INTRAMUSCULAR | Status: AC
  Administered 2022-03-15: 400 mg
  Filled 2022-03-15: qty 20

## 2022-03-15 MED ORDER — MIDAZOLAM HCL 5 MG/5ML IJ SOLN: 0.5000 mg | Freq: Once | INTRAMUSCULAR | Status: DC

## 2022-03-15 MED ORDER — ROPIVACAINE HCL 2 MG/ML IJ SOLN
18.0000 mL | Freq: Once | INTRAMUSCULAR | Status: AC
  Administered 2022-03-15: 18 mL via INTRA_ARTICULAR
  Filled 2022-03-15: qty 20

## 2022-03-15 MED ORDER — IOHEXOL 180 MG/ML  SOLN
10.0000 mL | Freq: Once | INTRAMUSCULAR | Status: AC
  Administered 2022-03-15: 10 mL via INTRA_ARTICULAR

## 2022-03-15 MED ORDER — METHYLPREDNISOLONE ACETATE 80 MG/ML IJ SUSP
160.0000 mg | Freq: Once | INTRAMUSCULAR | Status: AC
  Administered 2022-03-15: 160 mg via INTRA_ARTICULAR
  Filled 2022-03-15: qty 2

## 2022-03-15 MED ORDER — LACTATED RINGERS IV SOLN: 1000.0000 mL | Freq: Once | INTRAVENOUS | Status: DC

## 2022-03-15 NOTE — Patient Instructions (Signed)

## 2022-03-15 NOTE — Progress Notes (Signed)
Safety precautions to be maintained throughout the outpatient stay will include: orient to surroundings, keep bed in low position, maintain call bell within reach at all times, provide assistance with transfer out of bed and ambulation.  

## 2022-03-16 ENCOUNTER — Telehealth: Payer: Self-pay

## 2022-03-16 NOTE — Telephone Encounter (Signed)
Post procedure follow up.  LM 

## 2022-03-22 IMAGING — US US EXTREM LOW VENOUS*L*
2 series · 13 of 24 positions shown · non-contrast
Comparison: None.

CLINICAL DATA: Initial evaluation for acute left leg pain and
swelling.



[Series 1: us venous img lower bilat (dvt) · portal-venous · 12 of 35 slices shown]
[im 1/35]
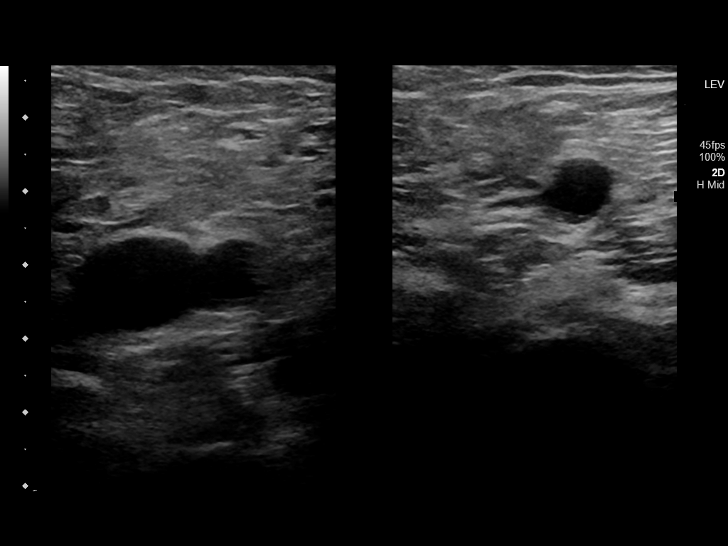
[im 4/35]
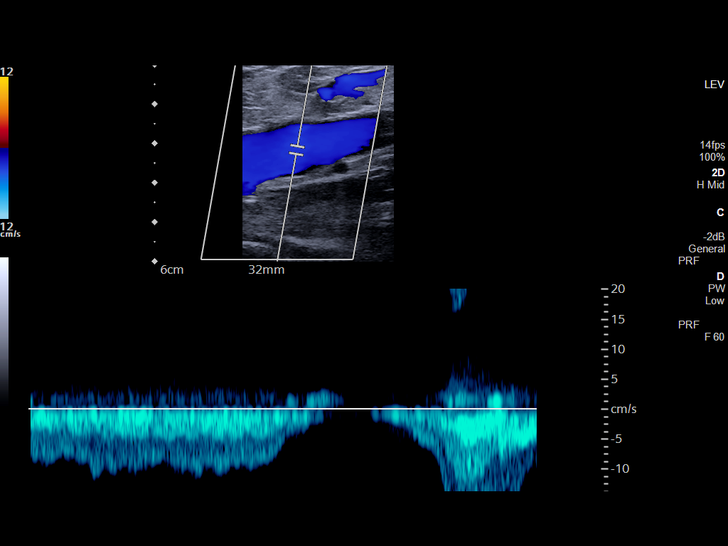
[im 7/35]
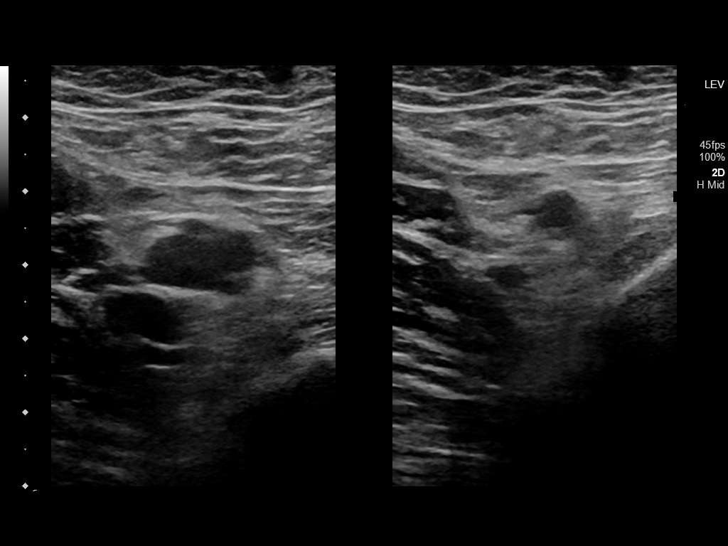
[im 10/35]
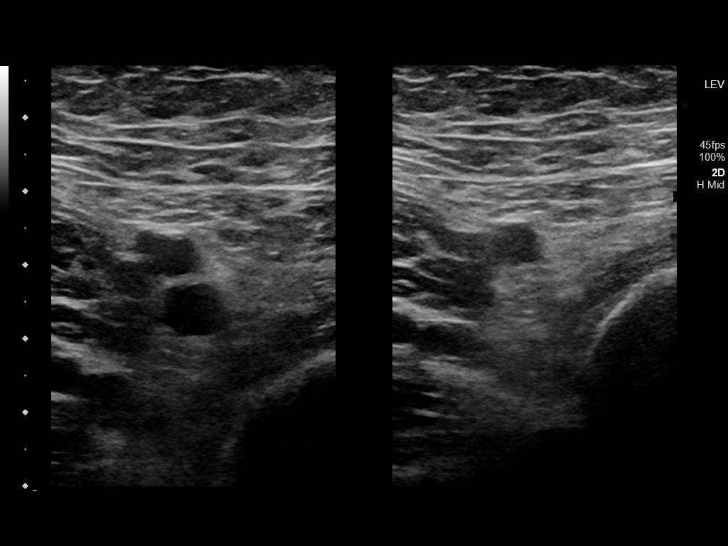
[im 13/35]
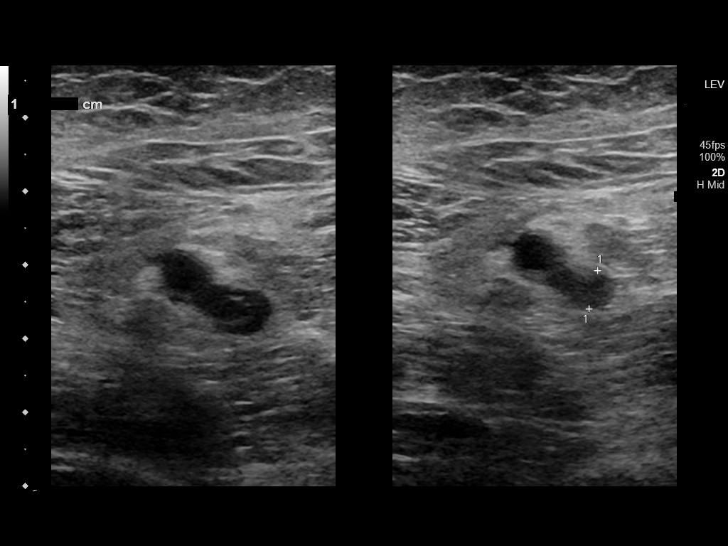
[im 17/35]
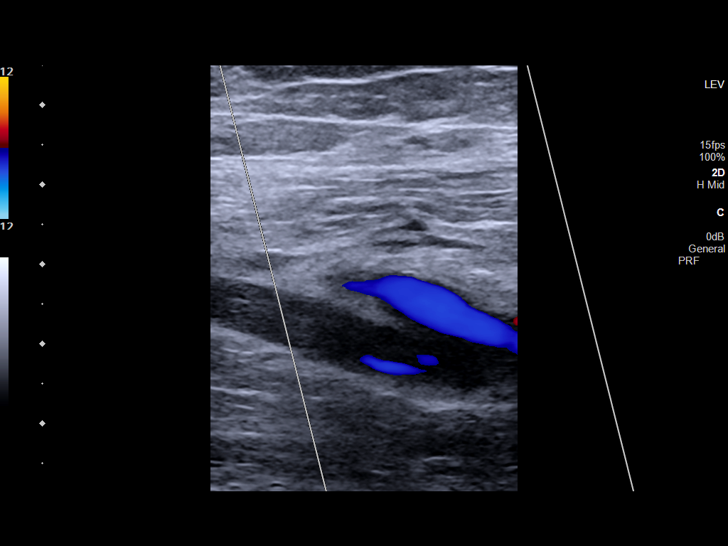
[im 20/35]
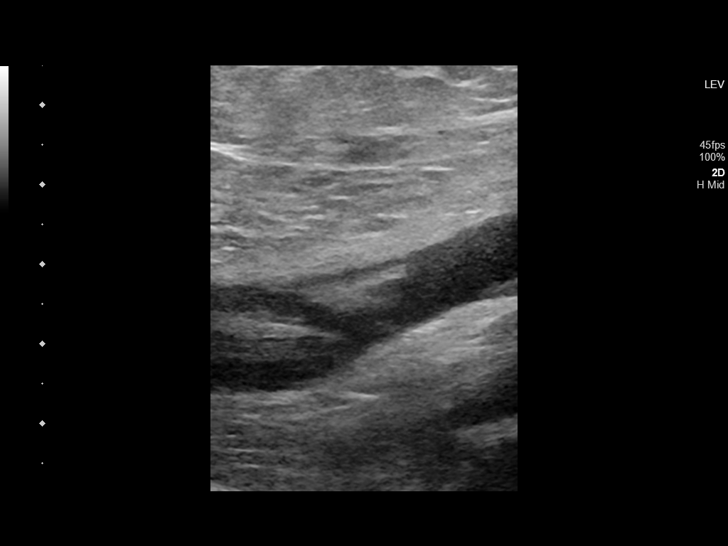
[im 22/35]
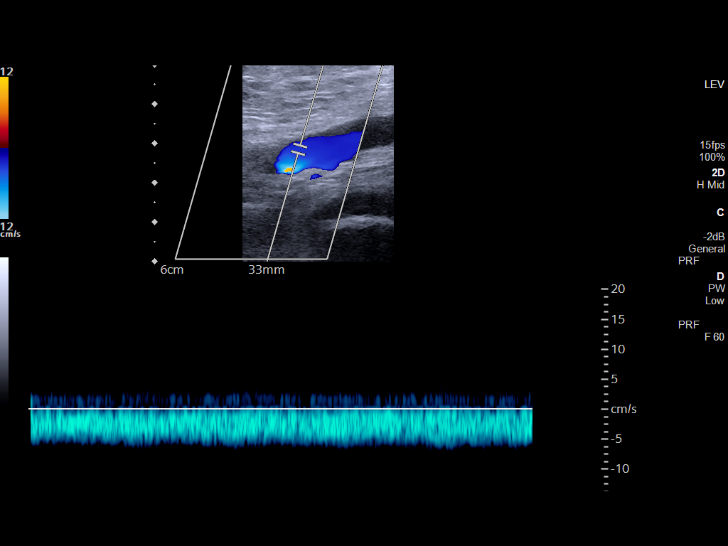
[im 25/35]
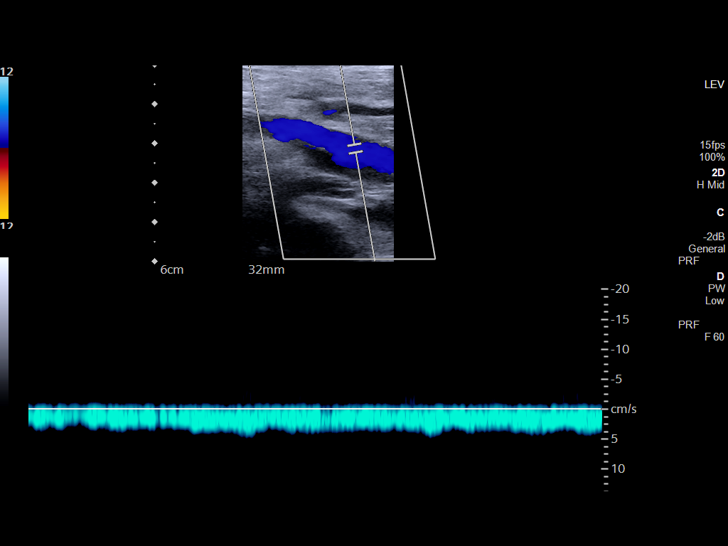
[im 28/35]
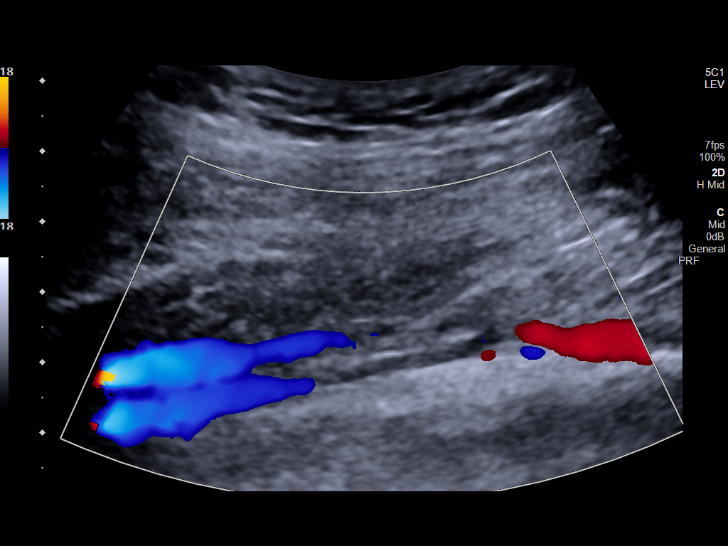
[im 31/35]
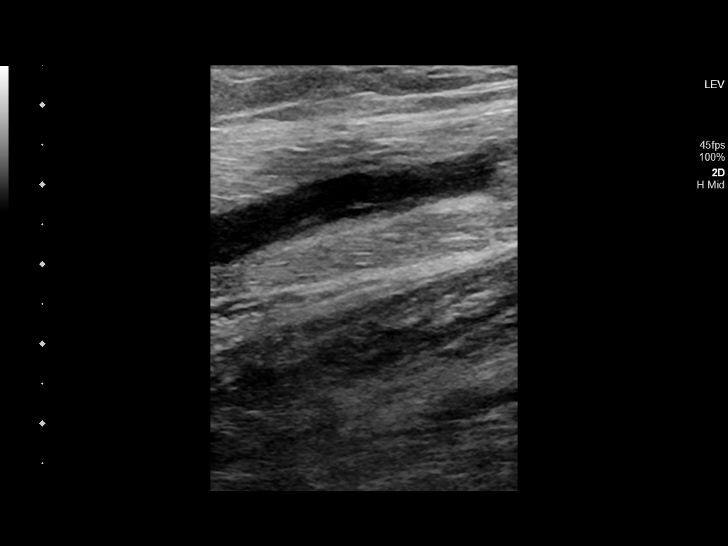
[im 35/35]
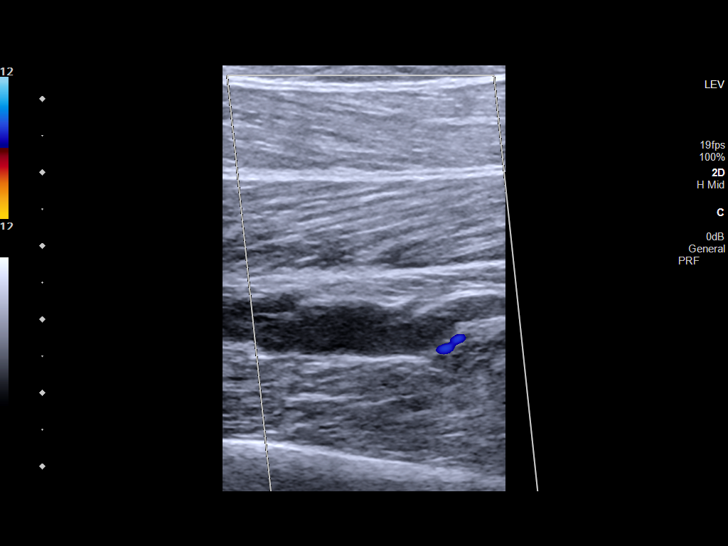

[Series 1001: lev · 1 of 3 slices shown]
[im 3/3]
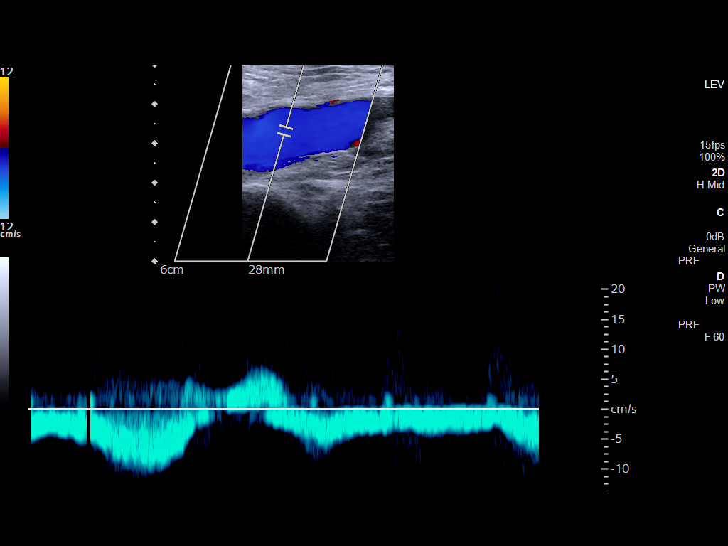

[13 of 24 positions shown; findings below may reference images not displayed]

FINDINGS: Contralateral Common Femoral Vein: Respiratory phasicity is normal
and symmetric with the symptomatic side. No evidence of thrombus.
Normal compressibility.

Common Femoral Vein: No evidence of thrombus. Normal
compressibility, respiratory phasicity and response to augmentation.

Saphenofemoral Junction: No evidence of thrombus. Normal
compressibility and flow on color Doppler imaging.

Profunda Femoral Vein: No evidence of thrombus. Normal
compressibility and flow on color Doppler imaging.

Femoral Vein: Echogenic material seen within the mid-distal left
femoral vein, consistent with acute thrombus. This is near occlusive
in nature. Loss of normal compressibility.

Popliteal Vein: Echogenic material partially fills the left
popliteal vein, consistent with nonocclusive thrombus. Loss of
normal compressibility.

Calf Veins: No evidence of thrombus. Normal compressibility and flow
on color Doppler imaging.

Superficial Great Saphenous Vein: No evidence of thrombus. Normal
compressibility.

Venous Reflux:  None.

Other Findings:  None.
IMPRESSION: Positive study for acute DVT with partially occlusive thrombus
extending from the mid-distal left femoral vein through the left
popliteal vein.

## 2022-03-29 ENCOUNTER — Other Ambulatory Visit: Payer: Self-pay

## 2022-03-29 ENCOUNTER — Ambulatory Visit: Payer: Managed Care, Other (non HMO) | Attending: Pain Medicine | Admitting: Pain Medicine

## 2022-03-29 ENCOUNTER — Encounter: Payer: Self-pay | Admitting: Pain Medicine

## 2022-03-29 VITALS — BP 139/83 | HR 99 | Temp 97.4°F | Resp 16 | Ht 68.0 in | Wt 194.0 lb

## 2022-03-29 DIAGNOSIS — M25552 Pain in left hip: Secondary | ICD-10-CM | POA: Diagnosis present

## 2022-03-29 DIAGNOSIS — M25551 Pain in right hip: Secondary | ICD-10-CM | POA: Insufficient documentation

## 2022-03-29 DIAGNOSIS — M7061 Trochanteric bursitis, right hip: Secondary | ICD-10-CM | POA: Diagnosis present

## 2022-03-29 DIAGNOSIS — M76892 Other specified enthesopathies of left lower limb, excluding foot: Secondary | ICD-10-CM | POA: Diagnosis present

## 2022-03-29 DIAGNOSIS — M7071 Other bursitis of hip, right hip: Secondary | ICD-10-CM | POA: Diagnosis present

## 2022-03-29 DIAGNOSIS — G8929 Other chronic pain: Secondary | ICD-10-CM | POA: Insufficient documentation

## 2022-03-29 DIAGNOSIS — M79604 Pain in right leg: Secondary | ICD-10-CM | POA: Insufficient documentation

## 2022-03-29 DIAGNOSIS — M7062 Trochanteric bursitis, left hip: Secondary | ICD-10-CM | POA: Insufficient documentation

## 2022-03-29 DIAGNOSIS — M79605 Pain in left leg: Secondary | ICD-10-CM | POA: Insufficient documentation

## 2022-03-29 DIAGNOSIS — G894 Chronic pain syndrome: Secondary | ICD-10-CM | POA: Insufficient documentation

## 2022-03-29 DIAGNOSIS — M76891 Other specified enthesopathies of right lower limb, excluding foot: Secondary | ICD-10-CM | POA: Insufficient documentation

## 2022-03-29 NOTE — Progress Notes (Signed)
PROVIDER NOTE: Information contained herein reflects review and annotations entered in association with encounter. Interpretation of such information and data should be left to medically-trained personnel. Information provided to patient can be located elsewhere in the medical record under "Patient Instructions". Document created using STT-dictation technology, any transcriptional errors that may result from process are unintentional.    Patient: Donna Liu  Service Category: E/M  Provider: Gaspar Cola, MD  DOB: 1966/01/16  DOS: 03/29/2022  Specialty: Interventional Pain Management  MRN: [id number]  Setting: Ambulatory outpatient  PCP: Donnamarie Rossetti, PA-C  Type: Established Patient    Referring Provider: Donnamarie Rossetti,*  Location: Office  Delivery: Face-to-face     HPI  Ms. Donna Liu, a 56 y.o. year old female, is here today because of her Chronic hip pain, bilateral [M25.551, M25.552, G89.29]. Ms. Donna Liu primary complain today is Hip Pain (Bilateral right is worse ) Last encounter: My last encounter with her was on 03/15/2022. Pertinent problems: Ms. Chisom has Fibromyalgia (6th area of Pain); Enthesopathy of hip region (Bilateral); Complex regional pain syndrome type 1 of right lower extremity; Chronic hip pain (1ry area of Pain) (Bilateral) (R>L); Neuropathy; Trochanteric bursitis of right hip; Chronic pain syndrome; Chronic lower extremity pain (2ry area of Pain) (Bilateral) (L>R); Chronic hand pain (3ry area of Pain) (Bilateral) (R>L); Chronic knee pain (4th area of Pain) (Bilateral) (R>L); Chronic upper back pain (5th area of Pain) (Midline); Trochanteric bursitis of hips (Bilateral); and Iliopsoas bursitis of hip (Right) on their pertinent problem list. Pain Assessment: Severity of Chronic pain is reported as a 2 /10. Location: Hip Left, Right/denies. Onset: More than a month ago. Quality: Discomfort, Constant, Pressure (almost like a muscle  weakness.). Timing: Constant. Modifying factor(s): procedure helped. Vitals:  height is _0  (1.727 m) and weight is 194 lb (88 kg). Her temporal temperature is 97.4 F (36.3 C) (abnormal). Her blood pressure is 139/83 and her pulse is 99. Her respiration is 16 and oxygen saturation is 99%.   Reason for encounter: post-procedure evaluation and assessment.  The patient indicates having attained 100% relief of the pain for the duration of the local anesthetic followed by a decrease benefit of 80% and later on ongoing 70% improvement.  Since the patient seems to be doing well for the time being, she has indicated no need to do anything else at this time.  She will contact us again if her pain worsens.  Post-procedure evaluation    Procedure #1:  Anesthesia, Analgesia, Anxiolysis:  Type: Intra-Articular Hip Injection #1  Primary Purpose: Diagnostic Region: Posterolateral hip joint area. Level: Lower pelvic and hip joint level. Target Area: Superior aspect of the hip joint cavity, going thru the superior portion of the capsular ligament. Approach: Posterolateral approach. Laterality: Bilateral  Anesthesia: Local (1-2% Lidocaine)  Anxiolysis: None  Sedation: None  Guidance: Fluoroscopy           Position: Prone Area Prepped: Entire Posterolateral hip area. DuraPrep (Iodine Povacrylex [0.7% available iodine] and Isopropyl Alcohol, 74% w/w)   Procedure #2:    Type: Bilateral Trochanteric Bursa & right iliopsoas bursa injection #1  Primary Purpose: Diagnostic Region: Upper (proximal) Femoral Region Level: Hip Joint Target Area: Superior aspect of the hip joint cavity, going thru the superior portion of the capsular ligament. Approach: Posterolateral approach Laterality: Bilateral     1. Chronic hip pain (1ry area of Pain) (Bilateral) (R>L)   2. Enthesopathy of hip region (Bilateral)   3. Trochanteric bursitis of hips (  Bilateral)    NAS-11 Pain score:   Pre-procedure: 7 /10    Post-procedure: 7 /10      Effectiveness:  Initial hour after procedure: 100 %. Subsequent 4-6 hours post-procedure: 80 %. Analgesia past initial 6 hours: 70 % (left hip is much better.  right hip is better but  there is pain in the upper leg and into the lower part of butt cheek.). Ongoing improvement:  Analgesic: The patient indicates currently having an ongoing 70% relief of her pain. Function: Ms. Donna Liu reports improvement in function ROM: Ms. Donna Liu reports improvement in ROM  Pharmacotherapy Assessment  Analgesic: Tramadol 50 mg tablet, 1 tab p.o. 4 times daily (# 20) (last filled on 12/07/2021) (prescribed by Haynes Kerns, MD) MME/day: 20 mg/day   Monitoring: [place] PMP: PDMP reviewed during this encounter.       Pharmacotherapy: No side-effects or adverse reactions reported. Compliance: No problems identified. Effectiveness: Clinically acceptable.  Janett Billow, RN  03/29/2022 12:20 PM  Sign when Signing Visit Safety precautions to be maintained throughout the outpatient stay will include: orient to surroundings, keep bed in low position, maintain call bell within reach at all times, provide assistance with transfer out of bed and ambulation.     UDS:  Summary  Date Value Ref Range Status  01/19/2022 Note  Final    Comment:    ==================================================================== Compliance Drug Analysis, Ur ==================================================================== Test                             Result       Flag       Units  Drug Present and Declared for Prescription Verification   Tramadol                       4528         EXPECTED   ng/mg creat   O-Desmethyltramadol            4428         EXPECTED   ng/mg creat   N-Desmethyltramadol            722          EXPECTED   ng/mg creat    Source of tramadol is a prescription medication. O-desmethyltramadol    and N-desmethyltramadol are expected metabolites of  tramadol.    Sertraline                     PRESENT      EXPECTED  Drug Present not Declared for Prescription Verification   Ibuprofen                      PRESENT      UNEXPECTED   Diphenhydramine                PRESENT      UNEXPECTED ==================================================================== Test                      Result    Flag   Units      Ref Range   Creatinine              36               mg/dL      >=20 ==================================================================== Declared Medications:  The flagging and interpretation on this report are based on the  following  declared medications.  Unexpected results may arise from  inaccuracies in the declared medications.   **Note: The testing scope of this panel includes these medications:   Sertraline (Zoloft)  Tramadol (Ultram)   **Note: The testing scope of this panel does not include the  following reported medications:   Amlodipine (Norvasc)  Olmesartan (Benicar) ==================================================================== For clinical consultation, please call [phone]. ====================================================================      ROS  Constitutional: Denies any fever or chills Gastrointestinal: No reported hemesis, hematochezia, vomiting, or acute GI distress Musculoskeletal: Denies any acute onset joint swelling, redness, loss of ROM, or weakness Neurological: No reported episodes of acute onset apraxia, aphasia, dysarthria, agnosia, amnesia, paralysis, loss of coordination, or loss of consciousness  Medication Review  Ibuprofen-diphenhydrAMINE HCl, Vitamin D (Ergocalciferol), amLODipine, olmesartan, sertraline, tiZANidine, and traMADol  History Review  Allergy: Ms. Donna Liu is allergic to penicillins, tapentadol, and sulfa antibiotics. Drug: Ms. Donna Liu  reports no history of drug use. Alcohol:  reports that she does not currently use alcohol. Tobacco:  reports that  she has been smoking cigarettes. She has a 26.25 pack-year smoking history. She has never used smokeless tobacco. Social: Ms. Donna Liu  reports that she has been smoking cigarettes. She has a 26.25 pack-year smoking history. She has never used smokeless tobacco. She reports that she does not currently use alcohol. She reports that she does not use drugs. Medical:  has a past medical history of Acid reflux, Adrenal nodule ([place]), Allergy, Anxiety, CRPS (complex regional pain syndrome type I), Depression, Fibromyalgia, Hypertension, and PTSD (post-traumatic stress disorder). Surgical: Ms. Donna Liu  has a past surgical history that includes Tonsillectomy; Cholecystectomy; Abdominal hysterectomy; Foot surgery (Right); Gastric bypass; and Appendectomy. Family: family history includes Atrial fibrillation in her mother; Healthy in her father; Hyperlipidemia in her mother; Hypertension in her mother.  Laboratory Chemistry Profile   Renal Lab Results  Component Value Date   BUN 11 01/19/2022   CREATININE 0.76 01/19/2022   BCR 14 01/19/2022   GFRAA >60 08/08/2015   GFRNONAA >60 04/15/2021    Hepatic Lab Results  Component Value Date   AST 22 01/19/2022   ALT 25 04/15/2021   ALBUMIN 4.8 01/19/2022   ALKPHOS 79 01/19/2022   LIPASE 45 04/15/2021    Electrolytes Lab Results  Component Value Date   NA 144 01/19/2022   K 4.8 01/19/2022   CL 104 01/19/2022   CALCIUM 10.0 01/19/2022   MG 2.4 (H) 01/19/2022    Bone Lab Results  Component Value Date   25OHVITD1 23 (L) 01/19/2022   25OHVITD2 6.1 01/19/2022   25OHVITD3 17 01/19/2022    Inflammation (CRP: Acute Phase) (ESR: Chronic Phase) Lab Results  Component Value Date   CRP 1 01/19/2022   ESRSEDRATE 26 01/19/2022         Note: Above Lab results reviewed.  Recent Imaging Review  DG PAIN CLINIC C-ARM 1-60 MIN NO REPORT Fluoro was used, but no Radiologist interpretation will be provided.  Please refer to "NOTES" tab for provider  progress note. Note: Reviewed        Physical Exam  General appearance: Well nourished, well developed, and well hydrated. In no apparent acute distress Mental status: Alert, oriented x 3 (person, place, & time)       Respiratory: No evidence of acute respiratory distress Eyes: PERLA Vitals: BP 139/83 (BP Location: Right Arm, Patient Position: Sitting, Cuff Size: Large)   Pulse 99   Temp (!) 97.4 F (36.3 C) (Temporal)   Resp 16  Ht _0  (1.727 m)   Wt 194 lb (88 kg)   SpO2 99%   BMI 29.50 kg/m  BMI: Estimated body mass index is 29.5 kg/m as calculated from the following:   Height as of this encounter: _1  (1.727 m).   Weight as of this encounter: 194 lb (88 kg). Ideal: Ideal body weight: 63.9 kg (140 lb 14 oz) Adjusted ideal body weight: 73.5 kg (162 lb 2 oz)  Assessment   Diagnosis Status  1. Chronic hip pain (1ry area of Pain) (Bilateral) (R>L)   2. Enthesopathy of hip region (Bilateral)   3. Trochanteric bursitis of hips (Bilateral)   4. Iliopsoas bursitis of hip (Right)   5. Chronic lower extremity pain (2ry area of Pain) (Bilateral) (L>R)   6. Chronic pain syndrome    Controlled Controlled Controlled   Updated Problems: No problems updated.  Plan of Care  Problem-specific:  No problem-specific Assessment & Plan notes found for this encounter.  Ms. Donna Liu has a current medication list which includes the following long-term medication(s): amlodipine, advil pm, olmesartan, and sertraline.  Pharmacotherapy (Medications Ordered): No orders of the defined types were placed in this encounter.  Orders:  No orders of the defined types were placed in this encounter.  Follow-up plan:   Return if symptoms worsen or fail to improve.     Interventional Therapies  Risk  Complexity Considerations:   Estimated body mass index is 31.47 kg/m as calculated from the following:   Height as of this encounter: _2  (1.727 m).   Weight as of this encounter:  207 lb (93.9 kg). WNL   Planned  Pending:   Diagnostic/therapeutic bilateral IA knee hip joint inj. #1   Under consideration:   Diagnostic/therapeutic IA hip joint inj. #1   Diagnostic left L5-S1 LESI  Diagnostic/therapeutic bilateral hand joint inj.  Diagnostic/therapeutic bilateral knee joint inj.  Diagnostic/therapeutic midline thoracic spine inj.    Completed:   Diagnostic/therapeutic right (L3) lumbar sympathetic Blk (03/04/2010) (75-100/75/75)    Completed by other providers:   Therapeutic right hip joint inj. x1 (07/22/2021) by Raelene Bott ([facility]) (Helped 85% improvement) Dx LE-EMG/PNCV (05/31/2021) by Dr. Gurney Maxin (bilateral chronic sensory polyneuropathy) Therapeutic right greater trochanteric bursa inj. x1 (05/08/2019) by Rosalia Hammers, DO  (06/20/2013) [facility] (RLE CRPS) Dia Crawford, MD (Lidocaine infusions x 4)    Therapeutic  Palliative (PRN) options:   None established    Recent Visits Date Type Provider Dept  03/15/22 Procedure visit Milinda Pointer, MD [facility]  02/14/22 Office Visit Milinda Pointer, MD [facility]  01/19/22 Office Visit Milinda Pointer, MD [facility]  Showing recent visits within past 90 days and meeting all other requirements Today's Visits Date Type Provider Dept  03/29/22 Office Visit Milinda Pointer, MD [facility]  Showing today's visits and meeting all other requirements Future Appointments No visits were found meeting these conditions. Showing future appointments within next 90 days and meeting all other requirements  I discussed the assessment and treatment plan with the patient. The patient was provided an opportunity to ask questions and all were answered. The patient agreed with the plan and demonstrated an understanding of the instructions.  Patient advised to call back or seek an in-person evaluation if the symptoms or  condition worsens.  Duration of encounter: 30 minutes.  Note by: Gaspar Cola, MD Date: 03/29/2022; Time: 5:02 PM

## 2022-03-29 NOTE — Progress Notes (Signed)
Safety precautions to be maintained throughout the outpatient stay will include: orient to surroundings, keep bed in low position, maintain call bell within reach at all times, provide assistance with transfer out of bed and ambulation.  

## 2022-05-05 ENCOUNTER — Other Ambulatory Visit (HOSPITAL_COMMUNITY): Payer: Self-pay | Admitting: Physician Assistant

## 2022-05-05 ENCOUNTER — Other Ambulatory Visit: Payer: Self-pay | Admitting: Physician Assistant

## 2022-05-05 ENCOUNTER — Ambulatory Visit
Admission: RE | Admit: 2022-05-05 | Discharge: 2022-05-05 | Disposition: A | Discharge status: Home / Self Care | Payer: Managed Care, Other (non HMO) | Source: Ambulatory Visit | Attending: Physician Assistant | Admitting: Physician Assistant

## 2022-05-05 DIAGNOSIS — R2242 Localized swelling, mass and lump, left lower limb: Secondary | ICD-10-CM | POA: Insufficient documentation

## 2022-08-15 ENCOUNTER — Encounter (INDEPENDENT_AMBULATORY_CARE_PROVIDER_SITE_OTHER): Payer: Self-pay

## 2022-08-16 ENCOUNTER — Ambulatory Visit
Admission: EM | Admit: 2022-08-16 | Discharge: 2022-08-16 | Disposition: A | Discharge status: Home / Self Care | Payer: Managed Care, Other (non HMO) | Attending: Emergency Medicine | Admitting: Emergency Medicine

## 2022-08-16 DIAGNOSIS — R051 Acute cough: Secondary | ICD-10-CM | POA: Diagnosis not present

## 2022-08-16 DIAGNOSIS — J0140 Acute pansinusitis, unspecified: Secondary | ICD-10-CM | POA: Diagnosis not present

## 2022-08-16 DIAGNOSIS — M26623 Arthralgia of bilateral temporomandibular joint: Secondary | ICD-10-CM | POA: Diagnosis not present

## 2022-08-16 MED ORDER — NAPROXEN 500 MG PO TABS: 500.0000 mg | ORAL_TABLET | Freq: Two times a day (BID) | ORAL | 0 refills | Status: DC

## 2022-08-16 MED ORDER — ALBUTEROL SULFATE HFA 108 (90 BASE) MCG/ACT IN AERS: 1.0000 | INHALATION_SPRAY | RESPIRATORY_TRACT | 0 refills | Status: DC | PRN

## 2022-08-16 MED ORDER — FLUTICASONE PROPIONATE 50 MCG/ACT NA SUSP: 2.0000 | Freq: Every day | NASAL | 0 refills | Status: DC

## 2022-08-16 MED ORDER — HYDROCOD POLI-CHLORPHE POLI ER 10-8 MG/5ML PO SUER: 5.0000 mL | Freq: Two times a day (BID) | ORAL | 0 refills | Status: DC | PRN

## 2022-08-16 MED ORDER — DOXYCYCLINE HYCLATE 100 MG PO CAPS: 100.0000 mg | ORAL_CAPSULE | Freq: Two times a day (BID) | ORAL | 0 refills | Status: AC

## 2022-08-16 NOTE — ED Triage Notes (Signed)
Pt c/o sinus pain, bilateral ears aching, cough onset Thursday. Cough worse at night

## 2022-08-16 NOTE — ED Provider Notes (Signed)
HPI  SUBJECTIVE:  Donna Liu is a 56 y.o. female who presents with 6 days of a dry cough, sinus pain and pressure, bilateral ear pain/pressure, muffled hearing, scratchy throat, occasional headache.  She had a fever Tmax 101 at the beginning of the illness.  She reports nasal congestion, clear rhinorrhea, postnasal drip, wheezing and chest soreness secondary to the cough.  She is unable to sleep at night because of the cough.  No body aches, facial swelling, upper dental pain, shortness of breath.  She has had 2 negative home COVID tests.  She took DayQuil within 6 hours of evaluation.  She has also tried Tylenol and NyQuil without improvement.  Her sinus pain and pressure is worse with lying down flat.  No antibiotics in the past 3 months.  She has a past medical history of hypertension, provoked DVT, no longer on anticoagulants, fibromyalgia and osteoporosis.  No history of TMJ arthralgia, pulmonary disease.  PCP[facility].   Past Medical History:  Diagnosis Date   Acid reflux    Adrenal nodule ([facility])    Allergy    Anxiety    CRPS (complex regional pain syndrome type I)    Depression    Fibromyalgia    Hypertension    PTSD (post-traumatic stress disorder)     Past Surgical History:  Procedure Laterality Date   ABDOMINAL HYSTERECTOMY     APPENDECTOMY     CHOLECYSTECTOMY     FOOT SURGERY Right    GASTRIC BYPASS     TONSILLECTOMY      Family History  Problem Relation Age of Onset   Hypertension Mother    Hyperlipidemia Mother    Atrial fibrillation Mother    Healthy Father    Breast cancer Neg Hx     Social History   Tobacco Use   Smoking status: Every Day    Packs/day: 0.75    Years: 35.00    Total pack years: 26.25    Types: Cigarettes   Smokeless tobacco: Never  Vaping Use   Vaping Use: Never used  Substance Use Topics   Alcohol use: Not Currently    Comment: occasional    Drug use: Never    No current facility-administered medications for this  encounter.  Current Outpatient Medications:    albuterol (VENTOLIN HFA) 108 (90 Base) MCG/ACT inhaler, Inhale 1-2 puffs into the lungs every 4 (four) hours as needed for wheezing or shortness of breath., Disp: 1 each, Rfl: 0   amLODipine (NORVASC) 10 MG tablet, Take 1 tablet (10 mg total) by mouth daily., Disp: 90 tablet, Rfl: 1   chlorpheniramine-HYDROcodone (TUSSIONEX) 10-8 MG/5ML, Take 5 mLs by mouth every 12 (twelve) hours as needed for cough., Disp: 60 mL, Rfl: 0   doxycycline (VIBRAMYCIN) 100 MG capsule, Take 1 capsule (100 mg total) by mouth 2 (two) times daily for 10 days., Disp: 20 capsule, Rfl: 0   fluticasone (FLONASE) 50 MCG/ACT nasal spray, Place 2 sprays into both nostrils daily., Disp: 16 g, Rfl: 0   olmesartan (BENICAR) 40 MG tablet, Take 40 mg by mouth daily., Disp: , Rfl:    sertraline (ZOLOFT) 100 MG tablet, Take 100 mg by mouth daily., Disp: , Rfl:    Vitamin D, Ergocalciferol, (DRISDOL) 1.25 MG (50000 UNIT) CAPS capsule, Take 50,000 Units by mouth once a week., Disp: , Rfl:   Allergies  Allergen Reactions   Penicillins Anaphylaxis and Nausea And Vomiting    Has patient had a PCN reaction causing immediate rash,  facial/tongue/throat swelling, SOB or lightheadedness with hypotension: Yes Has patient had a PCN reaction causing severe rash involving mucus membranes or skin necrosis: No Has patient had a PCN reaction that required hospitalization Yes Has patient had a PCN reaction occurring within the last 10 years: No If all of the above answers are "NO", then may proceed with Cephalosporin use.  Other reaction(s): Not available   Tapentadol Anaphylaxis   Sulfa Antibiotics Hives and Rash     ROS  As noted in HPI.   Physical Exam  BP (!) 167/100 (BP Location: Left Arm)   Pulse 87   Temp 98 F (36.7 C) (Oral)   Ht 5\' 8"  (1.727 m)   Wt 91.6 kg   SpO2 98%   BMI 30.71 kg/m   Constitutional: Well developed, well nourished, coughing Eyes:  EOMI, conjunctiva  normal bilaterally HENT: Normocephalic, atraumatic,mucus membranes moist.  Positive purulent nasal congestion.  Positive maxillary, frontal sinus tenderness.  Erythematous, swollen turbinates.  Normal oropharynx.  No obvious postnasal drip.  TMs normal bilaterally.  Positive bilateral TMJ tenderness, no crepitus.  Positive tenderness with palpation of tragus bilaterally. Respiratory: Normal inspiratory effort, lungs clear bilaterally, fair air movement.  No anterior, lateral chest wall tenderness Cardiovascular: Normal rate, regular rhythm, no murmurs, rubs, gallops GI: nondistended skin: No rash, skin intact Musculoskeletal: no deformities Neurologic: Alert & oriented x 3, no focal neuro deficits Psychiatric: Speech and behavior appropriate   ED Course   Medications - No data to display  No orders of the defined types were placed in this encounter.   No results found for this or any previous visit (from the past 24 hour(s)). No results found.  ED Clinical Impression  1. Acute non-recurrent pansinusitis   2. Acute cough   3. Bilateral temporomandibular joint pain      ED Assessment/Plan      [place] Narcotic database reviewed for this patient, and feel that the risk/benefit ratio today is favorable for proceeding with a prescription for controlled substance.  Last narcotic prescription filled in May.  1.  Acute pansinusitis/acute cough- will send home with doxycycline 100 mg p.o. twice daily for 10 days as she reports anaphylaxis with penicillins, Flonase, saline nasal irrigation, Mucinex, Tussionex 5 mL p.o. twice daily as needed.  Regularly scheduled albuterol inhaler with a spacer for 4 days, then as needed.  She does not need a prescription for spacer.  2.  Otalgia.  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  No evidence of otitis media.  Soft diet, Naprosyn.  No Flexeril as she states that it makes her excessively sleepy.  Discussed MDM, treatment plan, and plan for follow-up with  patient. patient agrees with plan.   Meds ordered this encounter  Medications   albuterol (VENTOLIN HFA) 108 (90 Base) MCG/ACT inhaler    Sig: Inhale 1-2 puffs into the lungs every 4 (four) hours as needed for wheezing or shortness of breath.    Dispense:  1 each    Refill:  0   fluticasone (FLONASE) 50 MCG/ACT nasal spray    Sig: Place 2 sprays into both nostrils daily.    Dispense:  16 g    Refill:  0   chlorpheniramine-HYDROcodone (TUSSIONEX) 10-8 MG/5ML    Sig: Take 5 mLs by mouth every 12 (twelve) hours as needed for cough.    Dispense:  60 mL    Refill:  0   doxycycline (VIBRAMYCIN) 100 MG capsule    Sig: Take 1 capsule (100 mg total) by  mouth 2 (two) times daily for 10 days.    Dispense:  20 capsule    Refill:  0      *This clinic note was created using [employer]. Therefore, there may be occasional mistakes despite careful proofreading.  ?    Melynda Ripple, MD 08/16/22 [phone]

## 2022-08-16 NOTE — Discharge Instructions (Addendum)
Finish the doxycycline, even if you feel better. Flonase, saline nasal irrigation with a [employer] sinus rinse with distilled water as often as you want, Mucinex, Tussionex 5 mL p.o. twice daily as needed.  Clustering albuterol inhaler using your spacer every 4 hours for 2 days and then every 6 hours for 2 days, then as needed.  You can back off on the albuterol if you start to improve sooner.  You do not have an ear infection.  Your ear pain is from your jaw joint.  Soft diet for the next for 5 days, Naprosyn twice a day.

## 2022-09-18 NOTE — Progress Notes (Unsigned)
PROVIDER NOTE: Information contained herein reflects review and annotations entered in association with encounter. Interpretation of such information and data should be left to medically-trained personnel. Information provided to patient can be located elsewhere in the medical record under "Patient Instructions". Document created using STT-dictation technology, any transcriptional errors that may result from process are unintentional.    Patient: Donna Liu  Service Category: E/M  Provider: Gaspar Cola, MD  DOB: 01/14/1966  DOS: 09/19/2022  Referring Provider: Donnamarie Rossetti  MRN: [id number]  Specialty: Interventional Pain Management  PCP: Donnamarie Rossetti, PA-C  Type: Established Patient  Setting: Ambulatory outpatient    Location: Office  Delivery: Face-to-face     HPI  Ms. Donna Liu, a 56 y.o. year old female, is here today because of her No primary diagnosis found.. Donna Liu primary complain today is No chief complaint on file. Last encounter: My last encounter with her was on 03/29/2022. Pertinent problems: Donna Liu has Fibromyalgia (6th area of Pain); Enthesopathy of hip region (Bilateral); Complex regional pain syndrome type 1 of right lower extremity; Chronic hip pain (1ry area of Pain) (Bilateral) (R>L); Neuropathy; Trochanteric bursitis of right hip; Chronic pain syndrome; Chronic lower extremity pain (2ry area of Pain) (Bilateral) (L>R); Chronic hand pain (3ry area of Pain) (Bilateral) (R>L); Chronic knee pain (4th area of Pain) (Bilateral) (R>L); Chronic upper back pain (5th area of Pain) (Midline); Trochanteric bursitis of hips (Bilateral); and Iliopsoas bursitis of hip (Right) on their pertinent problem list. Pain Assessment: Severity of   is reported as a  /10. Location:    / . Onset:  . Quality:  . Timing:  . Modifying factor(s):  Marland Kitchen Vitals:  vitals were not taken for this visit.  BMI: Estimated body mass index is 30.71 kg/m as  calculated from the following:   Height as of 08/16/22: _0  (1.727 m).   Weight as of 08/16/22: 202 lb (91.6 kg).  Reason for encounter:  *** . ***  Pharmacotherapy Assessment  Analgesic: Tramadol 50 mg tablet, 1 tab p.o. 4 times daily (# 20) (last filled on 12/07/2021) (prescribed by Haynes Kerns, MD) MME/day: 20 mg/day   Monitoring: [place] PMP: PDMP reviewed during this encounter.       Pharmacotherapy: No side-effects or adverse reactions reported. Compliance: No problems identified. Effectiveness: Clinically acceptable.  No notes on file  No results found for: "CBDTHCR" No results found for: "D8THCCBX" No results found for: "D9THCCBX"  UDS:  Summary  Date Value Ref Range Status  01/19/2022 Note  Final    Comment:    ==================================================================== Compliance Drug Analysis, Ur ==================================================================== Test                             Result       Flag       Units  Drug Present and Declared for Prescription Verification   Tramadol                       4528         EXPECTED   ng/mg creat   O-Desmethyltramadol            4428         EXPECTED   ng/mg creat   N-Desmethyltramadol            722          EXPECTED   ng/mg creat  Source of tramadol is a prescription medication. O-desmethyltramadol    and N-desmethyltramadol are expected metabolites of tramadol.    Sertraline                     PRESENT      EXPECTED  Drug Present not Declared for Prescription Verification   Ibuprofen                      PRESENT      UNEXPECTED   Diphenhydramine                PRESENT      UNEXPECTED ==================================================================== Test                      Result    Flag   Units      Ref Range   Creatinine              36               mg/dL      >=20 ==================================================================== Declared Medications:  The flagging and  interpretation on this report are based on the  following declared medications.  Unexpected results may arise from  inaccuracies in the declared medications.   **Note: The testing scope of this panel includes these medications:   Sertraline (Zoloft)  Tramadol (Ultram)   **Note: The testing scope of this panel does not include the  following reported medications:   Amlodipine (Norvasc)  Olmesartan (Benicar) ==================================================================== For clinical consultation, please call [phone]. ====================================================================       ROS  Constitutional: Denies any fever or chills Gastrointestinal: No reported hemesis, hematochezia, vomiting, or acute GI distress Musculoskeletal: Denies any acute onset joint swelling, redness, loss of ROM, or weakness Neurological: No reported episodes of acute onset apraxia, aphasia, dysarthria, agnosia, amnesia, paralysis, loss of coordination, or loss of consciousness  Medication Review  Vitamin D (Ergocalciferol), albuterol, amLODipine, chlorpheniramine-HYDROcodone, fluticasone, naproxen, olmesartan, and sertraline  History Review  Allergy: Donna Liu is allergic to penicillins, tapentadol, and sulfa antibiotics. Drug: Donna Liu  reports no history of drug use. Alcohol:  reports that she does not currently use alcohol. Tobacco:  reports that she has been smoking cigarettes. She has a 26.25 pack-year smoking history. She has never used smokeless tobacco. Social: Donna Liu  reports that she has been smoking cigarettes. She has a 26.25 pack-year smoking history. She has never used smokeless tobacco. She reports that she does not currently use alcohol. She reports that she does not use drugs. Medical:  has a past medical history of Acid reflux, Adrenal nodule ([place]), Allergy, Anxiety, CRPS (complex regional pain syndrome type I), Depression, Fibromyalgia,  Hypertension, and PTSD (post-traumatic stress disorder). Surgical: Donna Liu  has a past surgical history that includes Tonsillectomy; Cholecystectomy; Abdominal hysterectomy; Foot surgery (Right); Gastric bypass; and Appendectomy. Family: family history includes Atrial fibrillation in her mother; Healthy in her father; Hyperlipidemia in her mother; Hypertension in her mother.  Laboratory Chemistry Profile   Renal Lab Results  Component Value Date   BUN 11 01/19/2022   CREATININE 0.76 01/19/2022   BCR 14 01/19/2022   GFRAA >60 08/08/2015   GFRNONAA >60 04/15/2021    Hepatic Lab Results  Component Value Date   AST 22 01/19/2022   ALT 25 04/15/2021   ALBUMIN 4.8 01/19/2022   ALKPHOS 79 01/19/2022   LIPASE 45 04/15/2021    Electrolytes Lab Results  Component Value Date  NA 144 01/19/2022   K 4.8 01/19/2022   CL 104 01/19/2022   CALCIUM 10.0 01/19/2022   MG 2.4 (H) 01/19/2022    Bone Lab Results  Component Value Date   25OHVITD1 23 (L) 01/19/2022   25OHVITD2 6.1 01/19/2022   25OHVITD3 17 01/19/2022    Inflammation (CRP: Acute Phase) (ESR: Chronic Phase) Lab Results  Component Value Date   CRP 1 01/19/2022   ESRSEDRATE 26 01/19/2022         Note: Above Lab results reviewed.  Recent Imaging Review  US Venous Img Lower Unilateral Left (DVT) CLINICAL DATA:  Swelling, pain  EXAM: LEFT LOWER EXTREMITY VENOUS DOPPLER ULTRASOUND  TECHNIQUE: Gray-scale sonography with compression, as well as color and duplex ultrasound, were performed to evaluate the deep venous system(s) from the level of the common femoral vein through the popliteal and proximal calf veins.  COMPARISON:  10/20/2020 and previous  FINDINGS: VENOUS  Normal compressibility of the common femoral, superficial femoral, and popliteal veins, as well as the visualized calf veins. Visualized portions of profunda femoral vein and great saphenous vein unremarkable. No filling defects to suggest DVT  on grayscale or color Doppler imaging. Doppler waveforms show normal direction of venous flow, normal respiratory plasticity and response to augmentation.  Limited views of the contralateral common femoral vein are unremarkable.  OTHER  None.  Limitations: none  IMPRESSION: Negative.  Electronically Signed   By: Lucrezia Europe M.D.   On: 05/05/2022 16:57 Note: Reviewed        Physical Exam  General appearance: Well nourished, well developed, and well hydrated. In no apparent acute distress Mental status: Alert, oriented x 3 (person, place, & time)       Respiratory: No evidence of acute respiratory distress Eyes: PERLA Vitals: There were no vitals taken for this visit. BMI: Estimated body mass index is 30.71 kg/m as calculated from the following:   Height as of 08/16/22: _0  (1.727 m).   Weight as of 08/16/22: 202 lb (91.6 kg). Ideal: Patient weight not recorded  Assessment   Diagnosis Status  No diagnosis found. Controlled Controlled Controlled   Updated Problems: No problems updated.  Plan of Care  Problem-specific:  No problem-specific Assessment & Plan notes found for this encounter.  Ms. ADITRI LOUISCHARLES has a current medication list which includes the following long-term medication(s): albuterol, amlodipine, fluticasone, olmesartan, and sertraline.  Pharmacotherapy (Medications Ordered): No orders of the defined types were placed in this encounter.  Orders:  No orders of the defined types were placed in this encounter.  Follow-up plan:   No follow-ups on file.     Interventional Therapies  Risk  Complexity Considerations:   Estimated body mass index is 31.47 kg/m as calculated from the following:   Height as of this encounter: _1  (1.727 m).   Weight as of this encounter: 207 lb (93.9 kg). WNL   Planned  Pending:   Diagnostic/therapeutic bilateral IA knee hip joint inj. #1   Under consideration:   Diagnostic/therapeutic IA hip joint  inj. #1   Diagnostic left L5-S1 LESI  Diagnostic/therapeutic bilateral hand joint inj.  Diagnostic/therapeutic bilateral knee joint inj.  Diagnostic/therapeutic midline thoracic spine inj.    Completed:   Diagnostic/therapeutic right (L3) lumbar sympathetic Blk (03/04/2010) (75-100/75/75)    Completed by other providers:   Therapeutic right hip joint inj. x1 (07/22/2021) by Raelene Bott ([facility]) (Helped 85% improvement) Dx LE-EMG/PNCV (05/31/2021) by Dr. Gurney Maxin (bilateral chronic sensory polyneuropathy) Therapeutic right greater  trochanteric bursa inj. x1 (05/08/2019) by Rosalia Hammers, DO  (06/20/2013) [facility] (RLE CRPS) Dia Crawford, MD (Lidocaine infusions x 4)    Therapeutic  Palliative (PRN) options:   None established     Recent Visits No visits were found meeting these conditions. Showing recent visits within past 90 days and meeting all other requirements Future Appointments Date Type Provider Dept  09/19/22 Appointment Milinda Pointer, MD [facility]  Showing future appointments within next 90 days and meeting all other requirements  I discussed the assessment and treatment plan with the patient. The patient was provided an opportunity to ask questions and all were answered. The patient agreed with the plan and demonstrated an understanding of the instructions.  Patient advised to call back or seek an in-person evaluation if the symptoms or condition worsens.  Duration of encounter: *** minutes.  Total time on encounter, as per AMA guidelines included both the face-to-face and non-face-to-face time personally spent by the physician and/or other qualified health care professional(s) on the day of the encounter (includes time in activities that require the physician or other qualified health care professional and does not include time in activities normally performed by clinical staff). Physician's time may include the  following activities when performed: preparing to see the patient (eg, review of tests, pre-charting review of Liu) obtaining and/or reviewing separately obtained history performing a medically appropriate examination and/or evaluation counseling and educating the patient/family/caregiver ordering medications, tests, or procedures referring and communicating with other health care professionals (when not separately reported) documenting clinical information in the electronic or other health record independently interpreting results (not separately reported) and communicating results to the patient/ family/caregiver care coordination (not separately reported)  Note by: Gaspar Cola, MD Date: 09/19/2022; Time: 3:20 PM

## 2022-09-19 ENCOUNTER — Ambulatory Visit: Payer: Managed Care, Other (non HMO) | Attending: Pain Medicine | Admitting: Pain Medicine

## 2022-09-19 ENCOUNTER — Encounter: Payer: Self-pay | Admitting: Pain Medicine

## 2022-09-19 VITALS — BP 161/102 | HR 86 | Temp 97.2°F | Resp 16 | Ht 68.0 in | Wt 205.0 lb

## 2022-09-19 DIAGNOSIS — M25561 Pain in right knee: Secondary | ICD-10-CM | POA: Diagnosis present

## 2022-09-19 DIAGNOSIS — M25551 Pain in right hip: Secondary | ICD-10-CM | POA: Insufficient documentation

## 2022-09-19 DIAGNOSIS — G8929 Other chronic pain: Secondary | ICD-10-CM | POA: Insufficient documentation

## 2022-09-19 DIAGNOSIS — M79604 Pain in right leg: Secondary | ICD-10-CM | POA: Insufficient documentation

## 2022-09-19 DIAGNOSIS — G894 Chronic pain syndrome: Secondary | ICD-10-CM | POA: Diagnosis present

## 2022-09-19 DIAGNOSIS — M79605 Pain in left leg: Secondary | ICD-10-CM | POA: Insufficient documentation

## 2022-09-19 DIAGNOSIS — M7061 Trochanteric bursitis, right hip: Secondary | ICD-10-CM | POA: Insufficient documentation

## 2022-09-19 DIAGNOSIS — M7062 Trochanteric bursitis, left hip: Secondary | ICD-10-CM | POA: Insufficient documentation

## 2022-09-19 DIAGNOSIS — M25552 Pain in left hip: Secondary | ICD-10-CM | POA: Insufficient documentation

## 2022-09-19 DIAGNOSIS — M549 Dorsalgia, unspecified: Secondary | ICD-10-CM | POA: Diagnosis present

## 2022-09-19 DIAGNOSIS — M76891 Other specified enthesopathies of right lower limb, excluding foot: Secondary | ICD-10-CM | POA: Diagnosis present

## 2022-09-19 DIAGNOSIS — M25562 Pain in left knee: Secondary | ICD-10-CM | POA: Diagnosis present

## 2022-09-19 DIAGNOSIS — M76892 Other specified enthesopathies of left lower limb, excluding foot: Secondary | ICD-10-CM | POA: Insufficient documentation

## 2022-09-19 DIAGNOSIS — M7071 Other bursitis of hip, right hip: Secondary | ICD-10-CM | POA: Insufficient documentation

## 2022-09-19 NOTE — Patient Instructions (Signed)
______________________________________________________________________  Preparing for your procedure  During your procedure appointment there will be: No Prescription Refills. No disability issues to discussed. No medication changes or discussions.  Instructions: Food intake: Avoid eating anything solid for at least 8 hours prior to your procedure. Clear liquid intake: You may take clear liquids such as water up to 2 hours prior to your procedure. (No carbonated drinks. No soda.) Transportation: Unless otherwise stated by your physician, bring a driver. Morning Medicines: Except for blood thinners, take all of your other morning medications with a sip of water. Make sure to take your heart and blood pressure medicines. If your blood pressure's lower number is above 100, the case will be rescheduled. Blood thinners: If you take a blood thinner, but were not instructed to stop it, call our office [phone] and ask to talk to a nurse. Not stopping a blood thinner prior to certain procedures could lead to serious complications. Diabetics on insulin: Notify the staff so that you can be scheduled 1st case in the morning. If your diabetes requires high dose insulin, take only  of your normal insulin dose the morning of the procedure and notify the staff that you have done so. Preventing infections: Shower with an antibacterial soap the morning of your procedure.  Build-up your immune system: Take 1000 mg of Vitamin C with every meal (3 times a day) the day prior to your procedure. Antibiotics: Inform the nursing staff if you are taking any antibiotics or if you have any conditions that may require antibiotics prior to procedures. (Example: recent joint implants)   Pregnancy: If you are pregnant make sure to notify the nursing staff. Not doing so may result in injury to the fetus, including death.  Sickness: If you have a cold, fever, or any active infections, call and cancel or reschedule your  procedure. Receiving steroids while having an infection may result in complications. Arrival: You must be in the facility at least 30 minutes prior to your scheduled procedure. Tardiness: Your scheduled time is also the cutoff time. If you do not arrive at least 15 minutes prior to your procedure, you will be rescheduled.  Children: Do not bring any children with you. Make arrangements to keep them home. Dress appropriately: There is always a possibility that your clothing may get soiled. Avoid long dresses. Valuables: Do not bring any jewelry or valuables.  Reasons to call and reschedule or cancel your procedure: (Following these recommendations will minimize the risk of a serious complication.) Surgeries: Avoid having procedures within 2 weeks of any surgery. (Avoid for 2 weeks before or after any surgery). Flu Shots: Avoid having procedures within 2 weeks of a flu shots or . (Avoid for 2 weeks before or after immunizations). Barium: Avoid having a procedure within 7-10 days after having had a radiological study involving the use of radiological contrast. (Myelograms, Barium swallow or enema study). Heart attacks: Avoid any elective procedures or surgeries for the initial 6 months after a "Myocardial Infarction" (Heart Attack). Blood thinners: It is imperative that you stop these medications before procedures. Let us know if you if you take any blood thinner.  Infection: Avoid procedures during or within two weeks of an infection (including chest colds or gastrointestinal problems). Symptoms associated with infections include: Localized redness, fever, chills, night sweats or profuse sweating, burning sensation when voiding, cough, congestion, stuffiness, runny nose, sore throat, diarrhea, nausea, vomiting, cold or Flu symptoms, recent or current infections. It is specially important if the infection is   over the area that we intend to treat. Heart and lung problems: Symptoms that may suggest an  active cardiopulmonary problem include: cough, chest pain, breathing difficulties or shortness of breath, dizziness, ankle swelling, uncontrolled high or unusually low blood pressure, and/or palpitations. If you are experiencing any of these symptoms, cancel your procedure and contact your primary care physician for an evaluation.  Remember:  Regular Business hours are:  Monday to Thursday 8:00 AM to 4:00 PM  Provider's Schedule: Fusako Tanabe, MD:  Procedure days: Tuesday and Thursday 7:30 AM to 4:00 PM  Bilal Lateef, MD:  Procedure days: Monday and Wednesday 7:30 AM to 4:00 PM  ______________________________________________________________________    ____________________________________________________________________________________________  General Risks and Possible Complications  Patient Responsibilities: It is important that you read this as it is part of your informed consent. It is our duty to inform you of the risks and possible complications associated with treatments offered to you. It is your responsibility as a patient to read this and to ask questions about anything that is not clear or that you believe was not covered in this document.  Patient's Rights: You have the right to refuse treatment. You also have the right to change your mind, even after initially having agreed to have the treatment done. However, under this last option, if you wait until the last second to change your mind, you may be charged for the materials used up to that point.  Introduction: Medicine is not an exact science. Everything in Medicine, including the lack of treatment(s), carries the potential for danger, harm, or loss (which is by definition: Risk). In Medicine, a complication is a secondary problem, condition, or disease that can aggravate an already existing one. All treatments carry the risk of possible complications. The fact that a side effects or complications occurs, does not imply  that the treatment was conducted incorrectly. It must be clearly understood that these can happen even when everything is done following the highest safety standards.  No treatment: You can choose not to proceed with the proposed treatment alternative. The "PRO(s)" would include: avoiding the risk of complications associated with the therapy. The "CON(s)" would include: not getting any of the treatment benefits. These benefits fall under one of three categories: diagnostic; therapeutic; and/or palliative. Diagnostic benefits include: getting information which can ultimately lead to improvement of the disease or symptom(s). Therapeutic benefits are those associated with the successful treatment of the disease. Finally, palliative benefits are those related to the decrease of the primary symptoms, without necessarily curing the condition (example: decreasing the pain from a flare-up of a chronic condition, such as incurable terminal cancer).  General Risks and Complications: These are associated to most interventional treatments. They can occur alone, or in combination. They fall under one of the following six (6) categories: no benefit or worsening of symptoms; bleeding; infection; nerve damage; allergic reactions; and/or death. No benefits or worsening of symptoms: In Medicine there are no guarantees, only probabilities. No healthcare provider can ever guarantee that a medical treatment will work, they can only state the probability that it may. Furthermore, there is always the possibility that the condition may worsen, either directly, or indirectly, as a consequence of the treatment. Bleeding: This is more common if the patient is taking a blood thinner, either prescription or over the counter (example: Goody Powders, Fish oil, Aspirin, Garlic, etc.), or if suffering a condition associated with impaired coagulation (example: Hemophilia, cirrhosis of the liver, low platelet counts, etc.). However, even if   you  do not have one on these, it can still happen. If you have any of these conditions, or take one of these drugs, make sure to notify your treating physician. Infection: This is more common in patients with a compromised immune system, either due to disease (example: diabetes, cancer, human immunodeficiency virus [HIV], etc.), or due to medications or treatments (example: therapies used to treat cancer and rheumatological diseases). However, even if you do not have one on these, it can still happen. If you have any of these conditions, or take one of these drugs, make sure to notify your treating physician. Nerve Damage: This is more common when the treatment is an invasive one, but it can also happen with the use of medications, such as those used in the treatment of cancer. The damage can occur to small secondary nerves, or to large primary ones, such as those in the spinal cord and brain. This damage may be temporary or permanent and it may lead to impairments that can range from temporary numbness to permanent paralysis and/or brain death. Allergic Reactions: Any time a substance or material comes in contact with our body, there is the possibility of an allergic reaction. These can range from a mild skin rash (contact dermatitis) to a severe systemic reaction (anaphylactic reaction), which can result in death. Death: In general, any medical intervention can result in death, most of the time due to an unforeseen complication. ____________________________________________________________________________________________    

## 2022-09-27 ENCOUNTER — Ambulatory Visit
Admission: RE | Admit: 2022-09-27 | Discharge: 2022-09-27 | Disposition: A | Discharge status: Home / Self Care | Payer: Managed Care, Other (non HMO) | Source: Ambulatory Visit | Attending: Pain Medicine | Admitting: Pain Medicine

## 2022-09-27 ENCOUNTER — Encounter: Payer: Self-pay | Admitting: Pain Medicine

## 2022-09-27 ENCOUNTER — Ambulatory Visit: Payer: Managed Care, Other (non HMO) | Attending: Pain Medicine | Admitting: Pain Medicine

## 2022-09-27 VITALS — BP 150/84 | HR 91 | Temp 97.3°F | Resp 18 | Ht 68.0 in | Wt 205.0 lb

## 2022-09-27 DIAGNOSIS — M7062 Trochanteric bursitis, left hip: Secondary | ICD-10-CM | POA: Diagnosis present

## 2022-09-27 DIAGNOSIS — M25552 Pain in left hip: Secondary | ICD-10-CM | POA: Diagnosis not present

## 2022-09-27 DIAGNOSIS — M7071 Other bursitis of hip, right hip: Secondary | ICD-10-CM | POA: Diagnosis present

## 2022-09-27 DIAGNOSIS — M25551 Pain in right hip: Secondary | ICD-10-CM | POA: Diagnosis not present

## 2022-09-27 DIAGNOSIS — M7061 Trochanteric bursitis, right hip: Secondary | ICD-10-CM | POA: Diagnosis present

## 2022-09-27 DIAGNOSIS — M76892 Other specified enthesopathies of left lower limb, excluding foot: Secondary | ICD-10-CM | POA: Insufficient documentation

## 2022-09-27 DIAGNOSIS — G8929 Other chronic pain: Secondary | ICD-10-CM | POA: Diagnosis not present

## 2022-09-27 DIAGNOSIS — M76891 Other specified enthesopathies of right lower limb, excluding foot: Secondary | ICD-10-CM | POA: Insufficient documentation

## 2022-09-27 MED ORDER — MIDAZOLAM HCL 2 MG/2ML IJ SOLN: 0.5000 mg | Freq: Once | INTRAMUSCULAR | Status: DC

## 2022-09-27 MED ORDER — LACTATED RINGERS IV SOLN: Freq: Once | INTRAVENOUS | Status: DC

## 2022-09-27 MED ORDER — ROPIVACAINE HCL 2 MG/ML IJ SOLN
18.0000 mL | Freq: Once | INTRAMUSCULAR | Status: AC
  Administered 2022-09-27: 18 mL via INTRA_ARTICULAR
  Filled 2022-09-27: qty 20

## 2022-09-27 MED ORDER — IOHEXOL 180 MG/ML  SOLN
INTRAMUSCULAR | Status: AC
  Filled 2022-09-27: qty 20

## 2022-09-27 MED ORDER — METHYLPREDNISOLONE ACETATE 80 MG/ML IJ SUSP
160.0000 mg | Freq: Once | INTRAMUSCULAR | Status: AC
  Administered 2022-09-27: 160 mg via INTRA_ARTICULAR
  Filled 2022-09-27: qty 2

## 2022-09-27 MED ORDER — IOHEXOL 180 MG/ML  SOLN
10.0000 mL | Freq: Once | INTRAMUSCULAR | Status: AC
  Administered 2022-09-27: 10 mL via INTRA_ARTICULAR

## 2022-09-27 MED ORDER — LIDOCAINE HCL 2 % IJ SOLN
20.0000 mL | Freq: Once | INTRAMUSCULAR | Status: AC
  Administered 2022-09-27: 400 mg
  Filled 2022-09-27: qty 20

## 2022-09-27 MED ORDER — PENTAFLUOROPROP-TETRAFLUOROETH EX AERO
INHALATION_SPRAY | Freq: Once | CUTANEOUS | Status: AC
  Administered 2022-09-27: 30 via TOPICAL
  Filled 2022-09-27: qty 116

## 2022-09-27 NOTE — Patient Instructions (Addendum)
Pain Management Discharge Instructions  General Discharge Instructions :  If you need to reach your doctor call: Monday-Friday 8:00 am - 4:00 pm at [phone] or toll free [phone].  After clinic hours [phone] to have operator reach doctor.  Bring all of your medication bottles to all your appointments in the pain clinic.  To cancel or reschedule your appointment with Pain Management please remember to call 24 hours in advance to avoid a fee.  Refer to the educational materials which you have been given on: General Risks, I had my Procedure. Discharge Instructions, Post Sedation.  Post Procedure Instructions:  The drugs you were given will stay in your system until tomorrow, so for the next 24 hours you should not drive, make any legal decisions or drink any alcoholic beverages.  You may eat anything you prefer, but it is better to start with liquids then soups and crackers, and gradually work up to solid foods.  Please notify your doctor immediately if you have any unusual bleeding, trouble breathing or pain that is not related to your normal pain.  Depending on the type of procedure that was done, some parts of your body may feel week and/or numb.  This usually clears up by tonight or the next day.  Walk with the use of an assistive device or accompanied by an adult for the 24 hours.  You may use ice on the affected area for the first 24 hours.  Put ice in a Ziploc bag and cover with a towel and place against area 15 minutes on 15 minutes off.  You may switch to heat after 24 hours. ____________________________________________________________________________________________  Virtual Visits   ID our calls: Add these numbers to your list of contacts on your smart phone. Label it as "PAIN Management" Nursing: [phone]) [phone] (Main) Dr. Laban EmperorNaveira: [phone]   What is a "Virtual Visit"? It is an Administrator, sportselectronic healthcare encounter (medical visit) that takes place on real  time (NOT TEXT or E-MAIL) over the telephone or computer device (desktop, laptop, tablet, smart phone, etc.). It allows for more communication flexibility between the patient and the healthcare provider.  Who decides when these types of visits will be used? The physician.  Who is eligible for these types of visits? Only those patients that can be reliably reached over the telephone.  What do you mean by reliably? We do not have time to call everyone multiple times, therefore those patients that tend to screen calls and then call back later are not suitable candidates for this system. We all hate telemarketers and "Robocalls".  We understand how people are reluctant to pickup on calls from "unknown numbers", therefore, we suggest you add our numbers to your list of contacts. This way, you should be able to identify our calls. All of our numbers are available above.   Who is not eligible? This option is not appropriate for medication management. Patients on controlled substances have to come in for "Face-to-Face" encounters. Monitoring of these substances is mandatory. Virtual visits do not allow for unannounced drug screening tests or pill counts. Not bringing your pills, or the empty bottles, may result in no refill.  When will this type of visits be used? For follow-up after procedures on established patients, as long as they have had the same procedure done before.  Whenever you are physically unable to attend a regular appointment.   Can I request my medication visit to be "Virtual"? Yes. Available on a limited basis, only if you are unable  to physically attend your appointment. However, you may only receive a 30-day prescription. Abuse of this option may result in discontinuation of medication due to inability to properly monitor the therapy.  When will I be called?  You will receive an initial call from [phone], from our nursing staff, one business day prior to your appointment.  (For Monday appointments you will be called on Friday.) The purpose of this call is to review your medications and the results of any recent procedure.  If the nursing staff is unable to make contact, your virtual encounter may be canceled and rescheduled to a face-to-face visit.  Your provider will call you on the day of your appointment.  At what time will I be called? Providers will call whenever there is time available. Do not expect calls at any specific time. On the schedule, you will have an appointment time assigned to you however, this is seldom accurate. This is done simply to keep a list of patients to be called. Be advised that calls may come at anytime during the day. Calls start as early as 8:00 AM and go as late as 8:00 PM. This will depend on provider availability. The system is not perfect. If this is inconvenient for you, please request to be changed to an "in-person" appointment.   ____________________________________________________________________________________________    ____________________________________________________________________________________________  Post-Procedure Discharge Instructions  Instructions: Apply ice:  Purpose: This will minimize any swelling and discomfort after procedure.  When: Day of procedure, as soon as you get home. How: Fill a plastic sandwich bag with crushed ice. Cover it with a small towel and apply to injection site. How long: (15 min on, 15 min off) Apply for 15 minutes then remove x 15 minutes.  Repeat sequence on day of procedure, until you go to bed. Apply heat:  Purpose: To treat any soreness and discomfort from the procedure. When: Starting the next day after the procedure. How: Apply heat to procedure site starting the day following the procedure. How long: May continue to repeat daily, until discomfort goes away. Food intake: Start with clear liquids (like water) and advance to regular food, as tolerated.  Physical  activities: Keep activities to a minimum for the first 8 hours after the procedure. After that, then as tolerated. Driving: If you have received any sedation, be responsible and do not drive. You are not allowed to drive for 24 hours after having sedation. Blood thinner: (Applies only to those taking blood thinners) You may restart your blood thinner 6 hours after your procedure. Insulin: (Applies only to Diabetic patients taking insulin) As soon as you can eat, you may resume your normal dosing schedule. Infection prevention: Keep procedure site clean and dry. Shower daily and clean area with soap and water. Post-procedure Pain Diary: Extremely important that this be done correctly and accurately. Recorded information will be used to determine the next step in treatment. For the purpose of accuracy, follow these rules: Evaluate only the area treated. Do not report or include pain from an untreated area. For the purpose of this evaluation, ignore all other areas of pain, except for the treated area. After your procedure, avoid taking a long nap and attempting to complete the pain diary after you wake up. Instead, set your alarm clock to go off every hour, on the hour, for the initial 8 hours after the procedure. Document the duration of the numbing medicine, and the relief you are getting from it. Do not go to sleep and attempt  to complete it later. It will not be accurate. If you received sedation, it is likely that you were given a medication that may cause amnesia. Because of this, completing the diary at a later time may cause the information to be inaccurate. This information is needed to plan your care. Follow-up appointment: Keep your post-procedure follow-up evaluation appointment after the procedure (usually 2 weeks for most procedures, 6 weeks for radiofrequencies). DO NOT FORGET to bring you pain diary with you.   Expect: (What should I expect to see with my procedure?) From numbing medicine  (AKA: Local Anesthetics): Numbness or decrease in pain. You may also experience some weakness, which if present, could last for the duration of the local anesthetic. Onset: Full effect within 15 minutes of injected. Duration: It will depend on the type of local anesthetic used. On the average, 1 to 8 hours.  From steroids (Applies only if steroids were used): Decrease in swelling or inflammation. Once inflammation is improved, relief of the pain will follow. Onset of benefits: Depends on the amount of swelling present. The more swelling, the longer it will take for the benefits to be seen. In some cases, up to 10 days. Duration: Steroids will stay in the system x 2 weeks. Duration of benefits will depend on multiple posibilities including persistent irritating factors. Side-effects: If present, they may typically last 2 weeks (the duration of the steroids). Frequent: Cramps (if they occur, drink Gatorade and take over-the-counter Magnesium 450-500 mg once to twice a day); water retention with temporary weight gain; increases in blood sugar; decreased immune system response; increased appetite. Occasional: Facial flushing (red, warm cheeks); mood swings; menstrual changes. Uncommon: Long-term decrease or suppression of natural hormones; bone thinning. (These are more common with higher doses or more frequent use. This is why we prefer that our patients avoid having any injection therapies in other practices.)  Very Rare: Severe mood changes; psychosis; aseptic necrosis. From procedure: Some discomfort is to be expected once the numbing medicine wears off. This should be minimal if ice and heat are applied as instructed.  Call if: (When should I call?) You experience numbness and weakness that gets worse with time, as opposed to wearing off. New onset bowel or bladder incontinence. (Applies only to procedures done in the spine)  Emergency Numbers: Durning business hours (Monday - Thursday, 8:00 AM -  4:00 PM) (Friday, 9:00 AM - 12:00 Noon): [phone] After hours: [phone] NOTE: If you are having a problem and are unable connect with, or to talk to a provider, then go to your nearest urgent care or emergency department. If the problem is serious and urgent, please call 911. ____________________________________________________________________________________________    ____________________________________________________________________________________________  Patient Information update  To: All of our patients.  Re: Name change.  It has been made official that our current name, "[facility]"   will soon be changed to "[facility]".   The purpose of this change is to eliminate any confusion created by the concept of our practice being a "Medication Management Pain Clinic". In the past this has led to the misconception that we treat pain primarily by the use of prescription medications.  Nothing can be farther from the truth.   Understanding PAIN MANAGEMENT: To further understand what our practice does, you first have to understand that "Pain Management" is a subspecialty that requires additional training once a physician has completed their specialty training, which can  be in either Anesthesia, Neurology, Psychiatry, or Physical Medicine and Rehabilitation (PMR). Each one of these contributes to the final approach taken by each physician to the management of their patient's pain. To be a "Pain Management Specialist" you must have first completed one of the specialty trainings below.  Anesthesiologists - trained in clinical pharmacology and interventional techniques such as nerve blockade and regional as well as central neuroanatomy. They are trained to block pain before, during, and after surgical interventions.  Neurologists - trained in the diagnosis and pharmacological  treatment of complex neurological conditions, such as Multiple Sclerosis, Parkinson's, spinal cord injuries, and other systemic conditions that may be associated with symptoms that may include but are not limited to pain. They tend to rely primarily on the treatment of chronic pain using prescription medications.  Psychiatrist - trained in conditions affecting the psychosocial wellbeing of patients including but not limited to depression, anxiety, schizophrenia, personality disorders, addiction, and other substance use disorders that may be associated with chronic pain. They tend to rely primarily on the treatment of chronic pain using prescription medications.   Physical Medicine and Rehabilitation (PMR) physicians, also known as physiatrists - trained to treat a wide variety of medical conditions affecting the brain, spinal cord, nerves, bones, joints, ligaments, muscles, and tendons. Their training is primarily aimed at treating patients that have suffered injuries that have caused severe physical impairment. Their training is primarily aimed at the physical therapy and rehabilitation of those patients. They may also work alongside orthopedic surgeons or neurosurgeons using their expertise in assisting surgical patients to recover after their surgeries.  INTERVENTIONAL PAIN MANAGEMENT is sub-subspecialty of Pain Management.  Our physicians are Board-certified in Anesthesia, Pain Management, and Interventional Pain Management.  This meaning that not only have they been trained and Board-certified in their specialty of Anesthesia, and subspecialty of Pain Management, but they have also received further training in the sub-subspecialty of Interventional Pain Management, in order to become Board-certified as INTERVENTIONAL PAIN MANAGEMENT SPECIALIST.    Mission: Our goal is to use our skills in  INTERVENTIONAL PAIN MANAGEMENT as alternatives to the chronic use of prescription opioid medications for the  treatment of pain. To make this more clear, we have changed our name to reflect what we do and offer. We will continue to offer medication management assessment and recommendations, but we will not be taking over any patient's medication management.  ____________________________________________________________________________________________

## 2022-09-27 NOTE — Progress Notes (Signed)
Safety precautions to be maintained throughout the outpatient stay will include: orient to surroundings, keep bed in low position, maintain call bell within reach at all times, provide assistance with transfer out of bed and ambulation.  

## 2022-09-27 NOTE — Progress Notes (Signed)
PROVIDER NOTE: Interpretation of information contained herein should be left to medically-trained personnel. Specific patient instructions are provided elsewhere under "Patient Instructions" section of medical record. This document was created in part using STT-dictation technology, any transcriptional errors that may result from this process are unintentional.  Patient: Donna Liu Type: Established DOB: 1966/06/10 MRN: [id number] PCP: Wilford Corner, PA-C  Service: Procedure DOS: 09/27/2022 Setting: Ambulatory Location: Ambulatory outpatient facility Delivery: Face-to-face Provider: Oswaldo Done, MD Specialty: Interventional Pain Management Specialty designation: 09 Location: Outpatient facility Ref. Prov.: Debbra Riding Hestle,*    Procedure:               Type:  Intra-articular hip injection  #2  Laterality: Bilateral (-50)  Level: Lower pelvic and hip joint level.  Imaging: Fluoroscopy-guided         Anesthesia: Local anesthesia (1-2% Lidocaine) Anxiolysis: None.  No IV Sedation: No Sedation                       DOS: 09/27/2022  Performed by: Oswaldo Done, MD  Purpose: Diagnostic/Therapeutic Indications: Hip pain severe enough to impact quality of life or function. Rationale (medical necessity): procedure needed and proper for the diagnosis and/or treatment of Donna Liu's medical symptoms and needs. 1. Chronic hip pain (1ry area of Pain) (Bilateral) (R>L)   2. Enthesopathy of hip region (Bilateral)   3. Iliopsoas bursitis of hip (Right)   4. Trochanteric bursitis of hips (Bilateral)    NAS-11 Pain score:   Pre-procedure: 6 /10   Post-procedure: 2 /10      Target: Superior aspect of the hip joint cavity, going thru the superior portion of the capsular ligament.  Location: intra-articular  Region: Hip joint, upper (proximal) femoral region Approach: Percutaneous posterolateral approach. Type of procedure: Percutaneous joint injection    Position / Prep / Materials:  Position: Prone  Prep solution: DuraPrep (Iodine Povacrylex [0.7% available iodine] and Isopropyl Alcohol, 74% w/w) Prep Area:  Entire Posterolateral hip area. Materials:  Tray: Block tray Needle(s):  Type: Spinal  Gauge (G): 22  Length: 7-in  Qty: 2  Pre-op H&P Assessment:  Donna Liu is a 56 y.o. (year old), female patient, seen today for interventional treatment. She  has a past surgical history that includes Tonsillectomy; Cholecystectomy; Abdominal hysterectomy; Foot surgery (Right); Gastric bypass; and Appendectomy. Donna Liu has a current medication list which includes the following prescription(s): amlodipine, diclofenac sodium, naproxen, olmesartan, sertraline, and vitamin d (ergocalciferol). Her primarily concern today is the Hip Pain (right)  Initial Vital Signs:  Pulse/HCG Rate: 93ECG Heart Rate: 89 Temp: (!) 97.3 F (36.3 C) Resp: 16 BP: (!) 159/102 SpO2: 99 %  BMI: Estimated body mass index is 31.17 kg/m as calculated from the following:   Height as of this encounter: 5\' 8"  (1.727 m).   Weight as of this encounter: 205 lb (93 kg).  Risk Assessment: Allergies: Reviewed. She is allergic to penicillins, tapentadol, and sulfa antibiotics.  Allergy Precautions: None required Coagulopathies: Reviewed. None identified.  Blood-thinner therapy: None at this time Active Infection(s): Reviewed. None identified. Donna Liu is afebrile  Site Confirmation: Donna Liu was asked to confirm the procedure and laterality before marking the site Procedure checklist: Completed Consent: Before the procedure and under the influence of no sedative(s), amnesic(s), or anxiolytics, the patient was informed of the treatment options, risks and possible complications. To fulfill our ethical and legal obligations, as recommended by the American Medical Association's Code of Ethics, I  have informed the patient of my clinical impression; the nature  and purpose of the treatment or procedure; the risks, benefits, and possible complications of the intervention; the alternatives, including doing nothing; the risk(s) and benefit(s) of the alternative treatment(s) or procedure(s); and the risk(s) and benefit(s) of doing nothing. The patient was provided information about the general risks and possible complications associated with the procedure. These may include, but are not limited to: failure to achieve desired goals, infection, bleeding, organ or nerve damage, allergic reactions, paralysis, and death. In addition, the patient was informed of those risks and complications associated to the procedure, such as failure to decrease pain; infection; bleeding; organ or nerve damage with subsequent damage to sensory, motor, and/or autonomic systems, resulting in permanent pain, numbness, and/or weakness of one or several areas of the body; allergic reactions; (i.e.: anaphylactic reaction); and/or death. Furthermore, the patient was informed of those risks and complications associated with the medications. These include, but are not limited to: allergic reactions (i.e.: anaphylactic or anaphylactoid reaction(s)); adrenal axis suppression; blood sugar elevation that in diabetics may result in ketoacidosis or comma; water retention that in patients with history of congestive heart failure may result in shortness of breath, pulmonary edema, and decompensation with resultant heart failure; weight gain; swelling or edema; medication-induced neural toxicity; particulate matter embolism and blood vessel occlusion with resultant organ, and/or nervous system infarction; and/or aseptic necrosis of one or more joints. Finally, the patient was informed that Medicine is not an exact science; therefore, there is also the possibility of unforeseen or unpredictable risks and/or possible complications that may result in a catastrophic outcome. The patient indicated having understood  very clearly. We have given the patient no guarantees and we have made no promises. Enough time was given to the patient to ask questions, all of which were answered to the patient's satisfaction. Donna Liu has indicated that she wanted to continue with the procedure. Attestation: I, the ordering provider, attest that I have discussed with the patient the benefits, risks, side-effects, alternatives, likelihood of achieving goals, and potential problems during recovery for the procedure that I have provided informed consent. Date  Time: 09/27/2022  9:13 AM  Pre-Procedure Preparation:  Monitoring: As per clinic protocol. Respiration, ETCO2, SpO2, BP, heart rate and rhythm monitor placed and checked for adequate function Safety Precautions: Patient was assessed for positional comfort and pressure points before starting the procedure. Time-out: I initiated and conducted the "Time-out" before starting the procedure, as per protocol. The patient was asked to participate by confirming the accuracy of the "Time Out" information. Verification of the correct person, site, and procedure were performed and confirmed by me, the nursing staff, and the patient. "Time-out" conducted as per Joint Commission's Universal Protocol (UP.01.01.01). Time: 1012  Description/Narrative of Procedure:          Rationale (medical necessity): procedure needed and proper for the diagnosis and/or treatment of the patient's medical symptoms and needs. Procedural Technique Safety Precautions: Aspiration looking for blood return was conducted prior to all injections. At no point did we inject any substances, as a needle was being advanced. No attempts were made at seeking any paresthesias. Safe injection practices and needle disposal techniques used. Medications properly checked for expiration dates. SDV (single dose vial) medications used. Description of the Procedure: Protocol guidelines were followed. The patient was assisted  into a comfortable position. The target area was identified and the area prepped in the usual manner. Skin & deeper tissues infiltrated with local anesthetic.  Appropriate amount of time allowed to pass for local anesthetics to take effect. The procedure needles were then advanced to the target area. Proper needle placement secured. Negative aspiration confirmed. Solution injected in intermittent fashion, asking for systemic symptoms every 0.5cc of injectate. The needles were then removed and the area cleansed, making sure to leave some of the prepping solution back to take advantage of its long term bactericidal properties.  Technical description of procedure:  Skin & deeper tissues infiltrated with local anesthetic. Appropriate amount of time allowed to pass for local anesthetics to take effect. The procedure needles were then advanced to the target area. Proper needle placement secured. Negative aspiration confirmed. Solution injected in intermittent fashion, asking for systemic symptoms every 0.5cc of injectate. The needles were then removed and the area cleansed, making sure to leave some of the prepping solution back to take advantage of its long term bactericidal properties.       Vitals:   09/27/22 1003 09/27/22 1013 09/27/22 1018 09/27/22 1022  BP: (!) 135/99 (!) 144/92 (!) 153/92 (!) 150/84  Pulse: 91     Resp: 16 17 18 18   Temp:      TempSrc:      SpO2: 99% 95% 96% 97%  Weight:      Height:         Start Time: 1012 hrs. End Time: 1018 hrs.  Imaging Guidance (Non-Spinal):          Type of Imaging Technique: Fluoroscopy Guidance (Non-Spinal) Indication(s): Assistance in needle guidance and placement for procedures requiring needle placement in or near specific anatomical locations not easily accessible without such assistance. Exposure Time: Please see nurses notes. Contrast: Before injecting any contrast, we confirmed that the patient did not have an allergy to iodine, shellfish,  or radiological contrast. Once satisfactory needle placement was completed at the desired level, radiological contrast was injected. Contrast injected under live fluoroscopy. No contrast complications. See chart for type and volume of contrast used. Fluoroscopic Guidance: I was personally present during the use of fluoroscopy. "Tunnel Vision Technique" used to obtain the best possible view of the target area. Parallax error corrected before commencing the procedure. "Direction-depth-direction" technique used to introduce the needle under continuous pulsed fluoroscopy. Once target was reached, antero-posterior, oblique, and lateral fluoroscopic projection used confirm needle placement in all planes. Images permanently stored in EMR. Interpretation: I personally interpreted the imaging intraoperatively. Adequate needle placement confirmed in multiple planes. Appropriate spread of contrast into desired area was observed. No evidence of afferent or efferent intravascular uptake. Permanent images saved into the patient's record.  Post-operative Assessment:  Post-procedure Vital Signs:  Pulse/HCG Rate: 9188 Temp: (!) 97.3 F (36.3 C) Resp: 18 BP: (!) 150/84 SpO2: 97 %  EBL: None  Complications: No immediate post-treatment complications observed by team, or reported by patient.  Note: The patient tolerated the entire procedure well. A repeat set of vitals were taken after the procedure and the patient was kept under observation following institutional policy, for this type of procedure. Post-procedural neurological assessment was performed, showing return to baseline, prior to discharge. The patient was provided with post-procedure discharge instructions, including a section on how to identify potential problems. Should any problems arise concerning this procedure, the patient was given instructions to immediately contact us, at any time, without hesitation. In any case, we plan to contact the patient by  telephone for a follow-up status report regarding this interventional procedure.  Comments:  No additional relevant information.  Plan of Care  Orders:  Orders Placed This Encounter  Procedures   HIP INJECTION    Scheduling Instructions:     Side: Bilateral     Sedation: Patient's choice.     Timeframe: Today   DG PAIN CLINIC C-ARM 1-60 MIN NO REPORT    Intraoperative interpretation by procedural physician at [facility].    Standing Status:   Standing    Number of Occurrences:   1    Order Specific Question:   Reason for exam:    Answer:   Assistance in needle guidance and placement for procedures requiring needle placement in or near specific anatomical locations not easily accessible without such assistance.   Informed Consent Details: Physician/Practitioner Attestation; Transcribe to consent form and obtain patient signature    Nursing Order: Transcribe to consent form and obtain patient signature. Note: Always confirm laterality of pain with Donna Liu, before procedure.    Order Specific Question:   Physician/Practitioner attestation of informed consent for procedure/surgical case    Answer:   I, the physician/practitioner, attest that I have discussed with the patient the benefits, risks, side effects, alternatives, likelihood of achieving goals and potential problems during recovery for the procedure that I have provided informed consent.    Order Specific Question:   Procedure    Answer:   Hip injection    Order Specific Question:   Physician/Practitioner performing the procedure    Answer:   Geraldin Habermehl A. Laban Emperor, MD    Order Specific Question:   Indication/Reason    Answer:   Hip Joint Pain (Arthralgia)   Provide equipment / supplies at bedside    Procedure tray: "Block Tray" (Disposable  single use) Skin infiltration needle: Regular 1.5-in, 25-G, (x1) Block Needle type: Spinal Amount/quantity: 2 Size: Long (7-inch) Gauge: 22G    Standing Status:    Standing    Number of Occurrences:   1    Order Specific Question:   Specify    Answer:   Block Tray   Chronic Opioid Analgesic:  No chronic opioid analgesics therapy prescribed by our practice. Tramadol 50 mg tablet, 1 tab p.o. 4 times daily (# 20) (last filled on 12/07/2021) (prescribed by Levi Aland, MD) MME/day: 20 mg/day   Medications ordered for procedure: Meds ordered this encounter  Medications   lidocaine (XYLOCAINE) 2 % (with pres) injection 400 mg   pentafluoroprop-tetrafluoroeth (GEBAUERS) aerosol   DISCONTD: lactated ringers infusion   DISCONTD: midazolam (VERSED) injection 0.5-2 mg    Make sure Flumazenil is available in the pyxis when using this medication. If oversedation occurs, administer 0.2 mg IV over 15 sec. If after 45 sec no response, administer 0.2 mg again over 1 min; may repeat at 1 min intervals; not to exceed 4 doses (1 mg)   methylPREDNISolone acetate (DEPO-MEDROL) injection 160 mg   ropivacaine (PF) 2 mg/mL (0.2%) (NAROPIN) injection 18 mL   iohexol (OMNIPAQUE) 180 MG/ML injection 10 mL    Must be Myelogram-compatible. If not available, you may substitute with a water-soluble, non-ionic, hypoallergenic, myelogram-compatible radiological contrast medium.   Medications administered: We administered lidocaine, pentafluoroprop-tetrafluoroeth, methylPREDNISolone acetate, ropivacaine (PF) 2 mg/mL (0.2%), and iohexol.  See the medical record for exact dosing, route, and time of administration.  Follow-up plan:   Return in about 2 weeks (around 10/11/2022) for Proc-day (T,Th), (VV), (PPE).       Interventional Therapies  Risk  Complexity Considerations:   WNL   Planned  Pending:   Diagnostic/therapeutic bilateral IA hip joint inj. #2  She is interested in having the midline thoracic spine treated next.   Under consideration:   Diagnostic left L5-S1 LESI  Diagnostic/therapeutic bilateral hand joint inj.  Diagnostic/therapeutic bilateral knee  joint inj.  Diagnostic/therapeutic midline thoracic spine inj. #1    Completed:   Diagnostic/therapeutic bilateral IA hip joint inj. x1 (03/15/2022) (100/80/70/70)  Diagnostic/therapeutic right (L3) lumbar sympathetic Blk (03/04/2010) (75-100/75/75)    Completed by other providers:   Therapeutic right hip joint inj. 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 x1 (05/08/2019) by Dorthula Nettles, DO  (06/20/2013) [facility] ([facility]) Tamala Fothergill, MD (Lidocaine infusions x 4)    Therapeutic  Palliative (PRN) options:   None established      Recent Visits Date Type Provider Dept  09/19/22 Office Visit Delano Metz, MD [facility]  Showing recent visits within past 90 days and meeting all other requirements Today's Visits Date Type Provider Dept  09/27/22 Procedure visit Delano Metz, MD [facility]  Showing today's visits and meeting all other requirements Future Appointments Date Type Provider Dept  10/18/22 Appointment Delano Metz, MD [facility]  Showing future appointments within next 90 days and meeting all other requirements  Disposition: Discharge home  Discharge (Date  Time): 09/27/2022; 1030 hrs.   Primary Care Physician: Wilford Corner, PA-C Location: [facility] Note by: Oswaldo Done, MD Date: 09/27/2022; Time: 11:07 AM  Disclaimer:  Medicine is not an Visual merchandiser. The only guarantee in medicine is that nothing is guaranteed. It is important to note that the decision to proceed with this intervention was based on the information collected from the patient. The Data and conclusions were drawn from the patient's questionnaire, the interview, and the physical examination. Because the  information was provided in large part by the patient, it cannot be guaranteed that it has not been purposely or unconsciously manipulated. Every effort has been made to obtain as much relevant data as possible for this evaluation. It is important to note that the conclusions that lead to this procedure are derived in large part from the available data. Always take into account that the treatment will also be dependent on availability of resources and existing treatment guidelines, considered by other Pain Management Practitioners as being common knowledge and practice, at the time of the intervention. For Medico-Legal purposes, it is also important to point out that variation in procedural techniques and pharmacological choices are the acceptable norm. The indications, contraindications, technique, and results of the above procedure should only be interpreted and judged by a Board-Certified Interventional Pain Specialist with extensive familiarity and expertise in the same exact procedure and technique.

## 2022-09-28 ENCOUNTER — Telehealth: Payer: Self-pay

## 2022-09-28 NOTE — Telephone Encounter (Signed)
Post procedure follow up.  LM 

## 2022-10-03 ENCOUNTER — Emergency Department: Payer: Managed Care, Other (non HMO)

## 2022-10-03 ENCOUNTER — Encounter: Payer: Self-pay | Admitting: *Deleted

## 2022-10-03 ENCOUNTER — Other Ambulatory Visit: Payer: Self-pay

## 2022-10-03 ENCOUNTER — Emergency Department
Admission: EM | Admit: 2022-10-03 | Discharge: 2022-10-04 | Disposition: A | Discharge status: Home / Self Care | Payer: Managed Care, Other (non HMO) | Attending: Emergency Medicine | Admitting: Emergency Medicine

## 2022-10-03 DIAGNOSIS — R059 Cough, unspecified: Secondary | ICD-10-CM | POA: Insufficient documentation

## 2022-10-03 DIAGNOSIS — I10 Essential (primary) hypertension: Secondary | ICD-10-CM | POA: Insufficient documentation

## 2022-10-03 DIAGNOSIS — R519 Headache, unspecified: Secondary | ICD-10-CM | POA: Diagnosis present

## 2022-10-03 NOTE — ED Provider Notes (Signed)
   [facility] Provider Note    Event Date/Time   First MD Initiated Contact with Patient 10/03/22 2015     (approximate)   History   Hypertension   HPI  Donna Liu is a 56 y.o. female who presents with complaints of high blood pressure.  Patient reports her blood pressure has been high over the last several days, she has also had fatigue, headache, cough     Physical Exam   Triage Vital Signs: ED Triage Vitals [10/03/22 2020]  Enc Vitals Group     BP      Pulse      Resp      Temp      Temp src      SpO2      Weight      Height      Head Circumference      Peak Flow      Pain Score 0     Pain Loc      Pain Edu?      Excl. in GC?     Most recent vital signs: Vitals:   10/03/22 2024  BP: 126/78  Pulse: (!) 107  Resp: 20  Temp: 98.3 F (36.8 C)  SpO2: 93%     General: Awake, no distress.  CV:  Good peripheral perfusion.  Resp:  Normal effort.  Abd:  No distention.  Other:     ED Results / Procedures / Treatments   Labs (all labs ordered are listed, but only abnormal results are displayed) Labs Reviewed  RESP PANEL BY RT-PCR (RSV, FLU A&B, COVID)  [id number]     EKG  ED ECG REPORT I, Jene Every, the attending physician, personally viewed and interpreted this ECG.  Date: 10/03/2022  Rhythm: normal sinus rhythm QRS Axis: normal Intervals: normal ST/T Wave abnormalities: normal Narrative Interpretation: no evidence of acute ischemia    RADIOLOGY    PROCEDURES:  Critical Care performed:   Procedures   MEDICATIONS ORDERED IN ED: Medications - No data to display   IMPRESSION / MDM / ASSESSMENT AND PLAN / ED COURSE  I reviewed the triage vital signs and the nursing notes. Patient's presentation is most consistent with exacerbation of chronic illness.  Patient presents with elevated blood pressure as above also with viral symptoms.  Suspect viral illness, possibly related to elevated blood  pressures.  Blood pressure here is quite reassuring.  EKG is unremarkable.  Plan to do COVID swab and chest x-ray however the patient apparently left prior to these tests        FINAL CLINICAL IMPRESSION(S) / ED DIAGNOSES   Final diagnoses:  Hypertension, unspecified type     Rx / DC Orders   ED Discharge Orders     None        Note:  This document was prepared using Dragon voice recognition software and may include unintentional dictation errors.   Jene Every, MD 10/03/22 2340

## 2022-10-03 NOTE — ED Triage Notes (Signed)
Pt reports elevated blood pressure for the past week.  Pt also reports covid positive last week.  No chest pain or sob.  Pt has a cough   pt alert  speech clear.

## 2022-10-18 ENCOUNTER — Telehealth: Payer: Managed Care, Other (non HMO) | Admitting: Pain Medicine

## 2022-10-19 ENCOUNTER — Encounter: Payer: Self-pay | Admitting: Pain Medicine

## 2022-10-19 DIAGNOSIS — M7061 Trochanteric bursitis, right hip: Secondary | ICD-10-CM | POA: Insufficient documentation

## 2022-10-19 NOTE — Progress Notes (Unsigned)
Patient: Donna Liu  Service Category: E/M  Provider: Gaspar Cola, MD  DOB: Sep 18, 1966  DOS: 10/20/2022  Location: Office  MRN: [id number]  Setting: Ambulatory outpatient  Referring Provider: Donnamarie Rossetti,*  Type: Established Patient  Specialty: Interventional Pain Management  PCP: Donnamarie Rossetti, PA-C  Location: Remote location  Delivery: TeleHealth     Virtual Encounter - Pain Management PROVIDER NOTE: Information contained herein reflects review and annotations entered in association with encounter. Interpretation of such information and data should be left to medically-trained personnel. Information provided to patient can be located elsewhere in the medical record under "Patient Instructions". Document created using STT-dictation technology, any transcriptional errors that may result from process are unintentional.    Contact & Pharmacy Preferred: [phone] Home: [phone] (home) Mobile: [phone] (mobile) E-mail: [email or web address]  [id number] [id number]: [phone]: [phone]  Pre-screening  Ms. Donna Liu offered "in-person" vs "virtual" encounter. She indicated preferring virtual for this encounter.   Reason COVID-19*  Social distancing based on CDC and AMA recommendations.   I contacted KFay Recordson 10/20/2022 via telephone.      I clearly identified myself as FGaspar Cola MD. I verified that I was speaking with the correct person using two identifiers (Name: KJAKARIA Liu and date of birth: [birth year].  Consent I sought verbal advanced consent from KFay Recordsfor virtual visit interactions. I informed Donna Liu of possible security and privacy concerns, risks, and limitations associated with providing "not-in-person" medical evaluation and management services. I also informed Ms. Tinnel of the availability of "in-person"  appointments. Finally, I informed her that there would be a charge for the virtual visit and that she could be  personally, fully or partially, financially responsible for it. Ms. CApseyexpressed understanding and agreed to proceed.   Historic Elements   Ms. KSIGNA CHEEKis a 57y.o. year old, female patient evaluated today after our last contact on 09/27/2022. Ms. CFini has a past medical history of Acid reflux, Adrenal nodule ([place], Allergy, Anxiety, CRPS (complex regional pain syndrome type I), Depression, Fibromyalgia, Hypertension, Lymphangitis of lower extremity (02/19/2018), and PTSD (post-traumatic stress disorder). She also  has a past surgical history that includes Tonsillectomy; Cholecystectomy; Abdominal hysterectomy; Foot surgery (Right); Gastric bypass; and Appendectomy. Ms. CBowehas a current medication list which includes the following prescription(s): aspirin ec, diclofenac sodium, metoprolol tartrate, naproxen, olmesartan, sertraline, and vitamin d (ergocalciferol). She  reports that she has been smoking cigarettes. She has a 26.25 pack-year smoking history. She has never used smokeless tobacco. She reports that she does not currently use alcohol. She reports that she does not use drugs. Ms. CRoyeris allergic to penicillins, tapentadol, and sulfa antibiotics.  Estimated body mass index is 31.17 kg/m as calculated from the following:   Height as of 10/03/22: _1  (1.727 m).   Weight as of 10/03/22: 205 lb (93 kg).  HPI  Today, she is being contacted for a post-procedure assessment.  Post-procedure evaluation   Type:  Intra-articular hip injection  #2  Laterality: Bilateral (-50)  Level: Lower pelvic and hip joint level.  Imaging: Fluoroscopy-guided         Anesthesia: Local anesthesia (1-2% Lidocaine) Anxiolysis: None.  No IV Sedation: No Sedation                       DOS: 09/27/2022  Performed  by: Gaspar Cola, MD  Purpose:  Diagnostic/Therapeutic Indications: Hip pain severe enough to impact quality of life or function. Rationale (medical necessity): procedure needed and proper for the diagnosis and/or treatment of Donna Liu's medical symptoms and needs. 1. Chronic hip pain (1ry area of Pain) (Bilateral) (R>L)   2. Enthesopathy of hip region (Bilateral)   3. Iliopsoas bursitis of hip (Right)   4. Trochanteric bursitis of hips (Bilateral)    NAS-11 Pain score:   Pre-procedure: 6 /10   Post-procedure: 2 /10       Effectiveness:  Initial hour after procedure: 100 %. Subsequent 4-6 hours post-procedure: 100 %. Analgesia past initial 6 hours: 100 % (ongoing). Ongoing improvement:  Analgesic: The patient indicates having attained an ongoing 100% relief of her hip pain, bilaterally. Function: Donna Liu reports improvement in function ROM: Donna Liu reports improvement in ROM  Pharmacotherapy Assessment   Opioid Analgesic: No chronic opioid analgesics therapy prescribed by our practice. Tramadol 50 mg tablet, 1 tab p.o. 4 times daily (# 20) (last filled on 12/07/2021) (prescribed by Haynes Kerns, MD) MME/day: 20 mg/day   Monitoring: [place] PMP: PDMP reviewed during this encounter.       Pharmacotherapy: No side-effects or adverse reactions reported. Compliance: No problems identified. Effectiveness: Clinically acceptable. Plan: Refer to "POC". UDS:  Summary  Date Value Ref Range Status  01/19/2022 Note  Final    Comment:    ==================================================================== Compliance Drug Analysis, Ur ==================================================================== Test                             Result       Flag       Units  Drug Present and Declared for Prescription Verification   Tramadol                       4528         EXPECTED   ng/mg creat   O-Desmethyltramadol            4428         EXPECTED   ng/mg creat   N-Desmethyltramadol            722           EXPECTED   ng/mg creat    Source of tramadol is a prescription medication. O-desmethyltramadol    and N-desmethyltramadol are expected metabolites of tramadol.    Sertraline                     PRESENT      EXPECTED  Drug Present not Declared for Prescription Verification   Ibuprofen                      PRESENT      UNEXPECTED   Diphenhydramine                PRESENT      UNEXPECTED ==================================================================== Test                      Result    Flag   Units      Ref Range   Creatinine              36               mg/dL      >=20 ==================================================================== Declared Medications:  The flagging and  interpretation on this report are based on the  following declared medications.  Unexpected results may arise from  inaccuracies in the declared medications.   **Note: The testing scope of this panel includes these medications:   Sertraline (Zoloft)  Tramadol (Ultram)   **Note: The testing scope of this panel does not include the  following reported medications:   Amlodipine (Norvasc)  Olmesartan (Benicar) ==================================================================== For clinical consultation, please call [phone]. ====================================================================    No results found for: "CBDTHCR", "D8THCCBX", "D9THCCBX"   Laboratory Chemistry Profile   Renal Lab Results  Component Value Date   BUN 11 01/19/2022   CREATININE 0.76 01/19/2022   BCR 14 01/19/2022   GFRAA >60 08/08/2015   GFRNONAA >60 04/15/2021    Hepatic Lab Results  Component Value Date   AST 22 01/19/2022   ALT 25 04/15/2021   ALBUMIN 4.8 01/19/2022   ALKPHOS 79 01/19/2022   LIPASE 45 04/15/2021    Electrolytes Lab Results  Component Value Date   NA 144 01/19/2022   K 4.8 01/19/2022   CL 104 01/19/2022   CALCIUM 10.0 01/19/2022   MG 2.4 (H) 01/19/2022    Bone Lab Results   Component Value Date   25OHVITD1 23 (L) 01/19/2022   25OHVITD2 6.1 01/19/2022   25OHVITD3 17 01/19/2022    Inflammation (CRP: Acute Phase) (ESR: Chronic Phase) Lab Results  Component Value Date   CRP 1 01/19/2022   ESRSEDRATE 26 01/19/2022         Note: Above Lab results reviewed.  Imaging  DG PAIN CLINIC C-ARM 1-60 MIN NO REPORT Fluoro was used, but no Radiologist interpretation will be provided.  Please refer to "NOTES" tab for provider progress note.  Assessment  The primary encounter diagnosis was Chronic hip pain (1ry area of Pain) (Bilateral) (R>L). Diagnoses of Chronic lower extremity pain (2ry area of Pain) (Bilateral) (L>R), Trochanteric bursitis of hips (Bilateral), and Iliopsoas bursitis of hip (Right) were also pertinent to this visit.  Plan of Care  Problem-specific:  No problem-specific Assessment & Plan notes found for this encounter.  Donna Liu has a current medication list which includes the following long-term medication(s): metoprolol tartrate, olmesartan, and sertraline.  Pharmacotherapy (Medications Ordered): No orders of the defined types were placed in this encounter.  Orders:  No orders of the defined types were placed in this encounter.  Follow-up plan:   No follow-ups on file.     Interventional Therapies  Risk  Complexity Considerations:   WNL   Planned  Pending:   Diagnostic/therapeutic bilateral IA hip joint inj. #2  She is interested in having the midline thoracic spine treated next.   Under consideration:   Diagnostic left L5-S1 LESI  Diagnostic/therapeutic bilateral hand joint inj.  Diagnostic/therapeutic bilateral knee joint inj.  Diagnostic/therapeutic midline thoracic spine inj. #1    Completed:   Diagnostic/therapeutic bilateral IA hip joint inj. x1 (03/15/2022) (100/80/70/70)  Diagnostic/therapeutic right (L3) lumbar sympathetic Blk (03/04/2010) (75-100/75/75)    Completed by other providers:   Therapeutic  right hip joint inj. x1 (07/22/2021) by Raelene Bott ([facility]) (Helped 85% improvement) Dx LE-EMG/PNCV (05/31/2021) by Dr. Gurney Maxin (bilateral chronic sensory polyneuropathy) Therapeutic right greater trochanteric bursa inj. x1 (05/08/2019) by Rosalia Hammers, DO  (06/20/2013) [facility] (RLE CRPS) Dia Crawford, MD (Lidocaine infusions x 4)    Therapeutic  Palliative (PRN) options:   None established       Recent Visits Date Type Provider Dept  09/27/22 Procedure visit Dossie Arbour,  Beatriz Chancellor, [facility]  09/19/22 Office Visit Milinda Pointer, MD [facility]  Showing recent visits within past 90 days and meeting all other requirements Today's Visits Date Type Provider Dept  10/20/22 Office Visit Milinda Pointer, MD [facility]  Showing today's visits and meeting all other requirements Future Appointments No visits were found meeting these conditions. Showing future appointments within next 90 days and meeting all other requirements  I discussed the assessment and treatment plan with the patient. The patient was provided an opportunity to ask questions and all were answered. The patient agreed with the plan and demonstrated an understanding of the instructions.  Patient advised to call back or seek an in-person evaluation if the symptoms or condition worsens.  Duration of encounter: 12 minutes.  Note by: Gaspar Cola, MD Date: 10/20/2022; Time: 12:50 PM

## 2022-10-20 ENCOUNTER — Ambulatory Visit: Payer: 59 | Attending: Pain Medicine | Admitting: Pain Medicine

## 2022-10-20 DIAGNOSIS — M7071 Other bursitis of hip, right hip: Secondary | ICD-10-CM

## 2022-10-20 DIAGNOSIS — M7062 Trochanteric bursitis, left hip: Secondary | ICD-10-CM

## 2022-10-20 DIAGNOSIS — M79604 Pain in right leg: Secondary | ICD-10-CM | POA: Diagnosis not present

## 2022-10-20 DIAGNOSIS — M79605 Pain in left leg: Secondary | ICD-10-CM

## 2022-10-20 DIAGNOSIS — M25552 Pain in left hip: Secondary | ICD-10-CM

## 2022-10-20 DIAGNOSIS — M7061 Trochanteric bursitis, right hip: Secondary | ICD-10-CM | POA: Diagnosis not present

## 2022-10-20 DIAGNOSIS — M25551 Pain in right hip: Secondary | ICD-10-CM

## 2022-10-20 DIAGNOSIS — G8929 Other chronic pain: Secondary | ICD-10-CM

## 2023-03-27 ENCOUNTER — Other Ambulatory Visit: Payer: Self-pay

## 2023-03-27 DIAGNOSIS — I10 Essential (primary) hypertension: Secondary | ICD-10-CM

## 2023-03-27 DIAGNOSIS — R002 Palpitations: Secondary | ICD-10-CM

## 2023-04-12 ENCOUNTER — Other Ambulatory Visit: Payer: 59

## 2023-04-13 ENCOUNTER — Ambulatory Visit: Payer: 59

## 2023-06-23 IMAGING — CR DG KNEE COMPLETE 4+V*L*
1 series · 4 of 4 positions shown · non-contrast
Comparison: None.

CLINICAL DATA: Lateral side of bilateral knee and popliteal pain
with arthralgia. Technologist note reports patient has fibromyalgia
and osteoporosis.

EXAM:
LEFT KNEE - COMPLETE 4+ VIEW

[Series 1: dg knee complete 4 views left · 0.14mm/px · 4 of 4 slices shown]
[im 1/4]
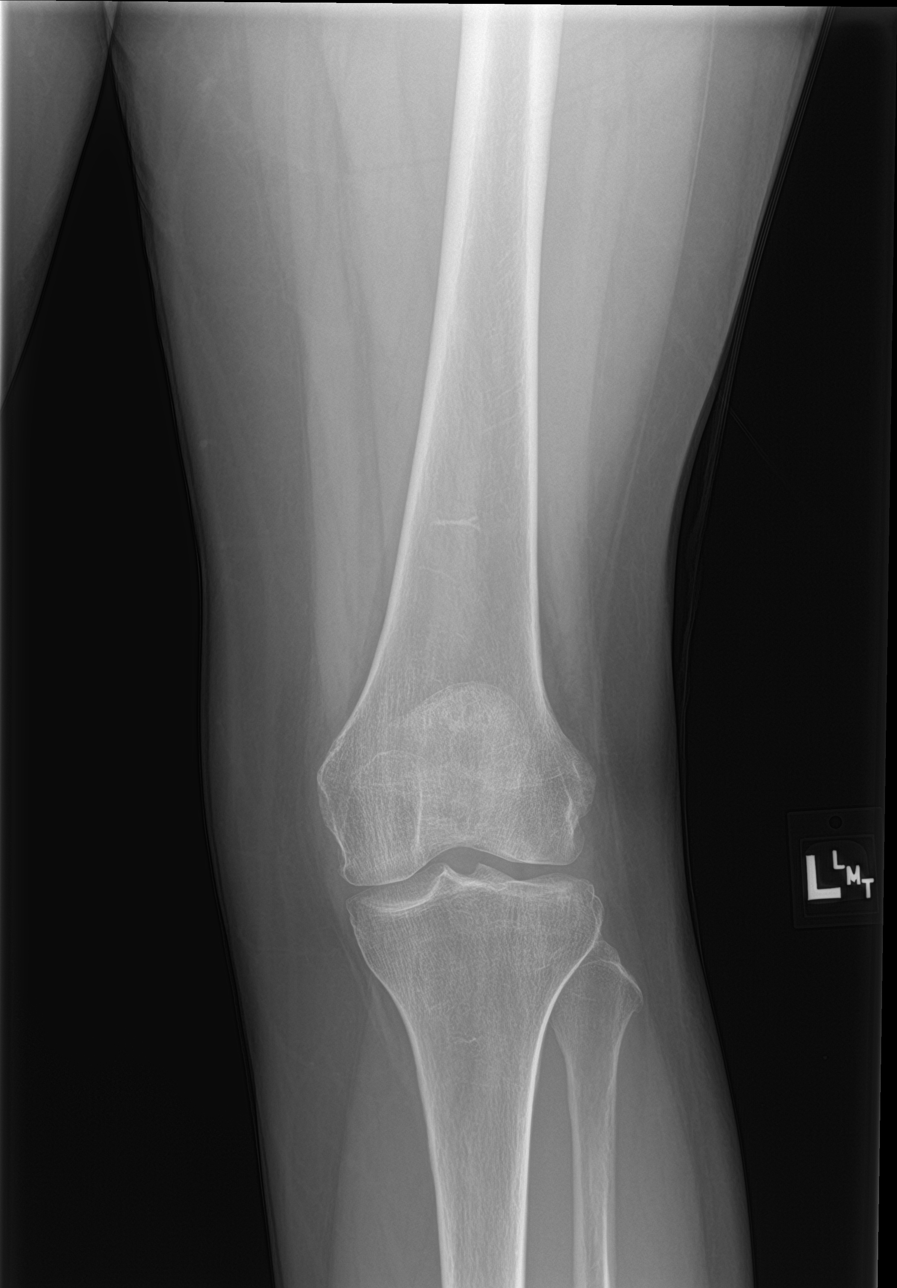
[im 2/4]
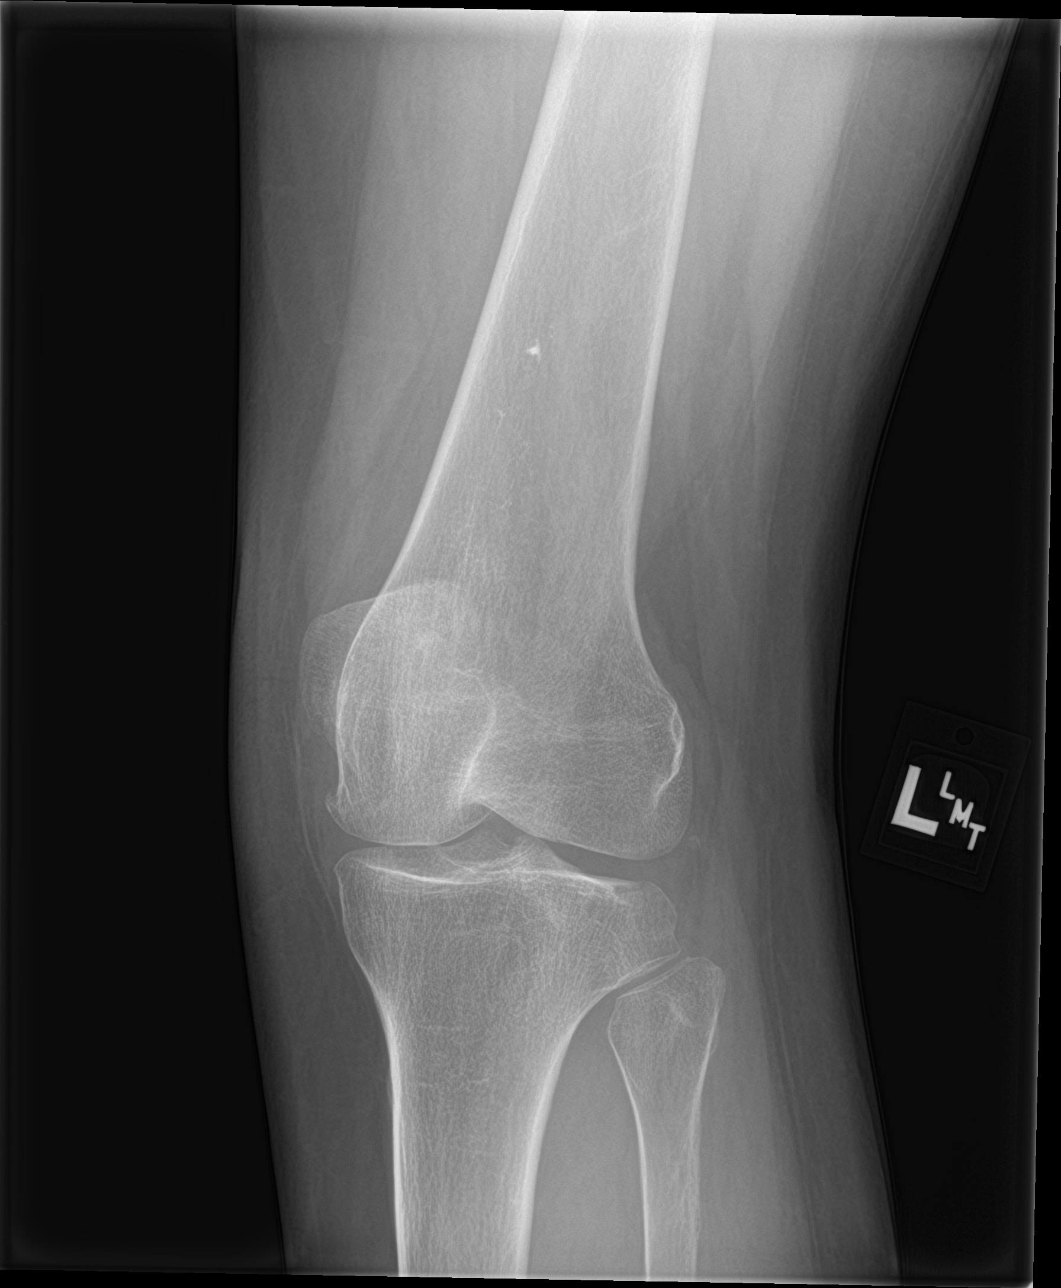
[im 3/4]
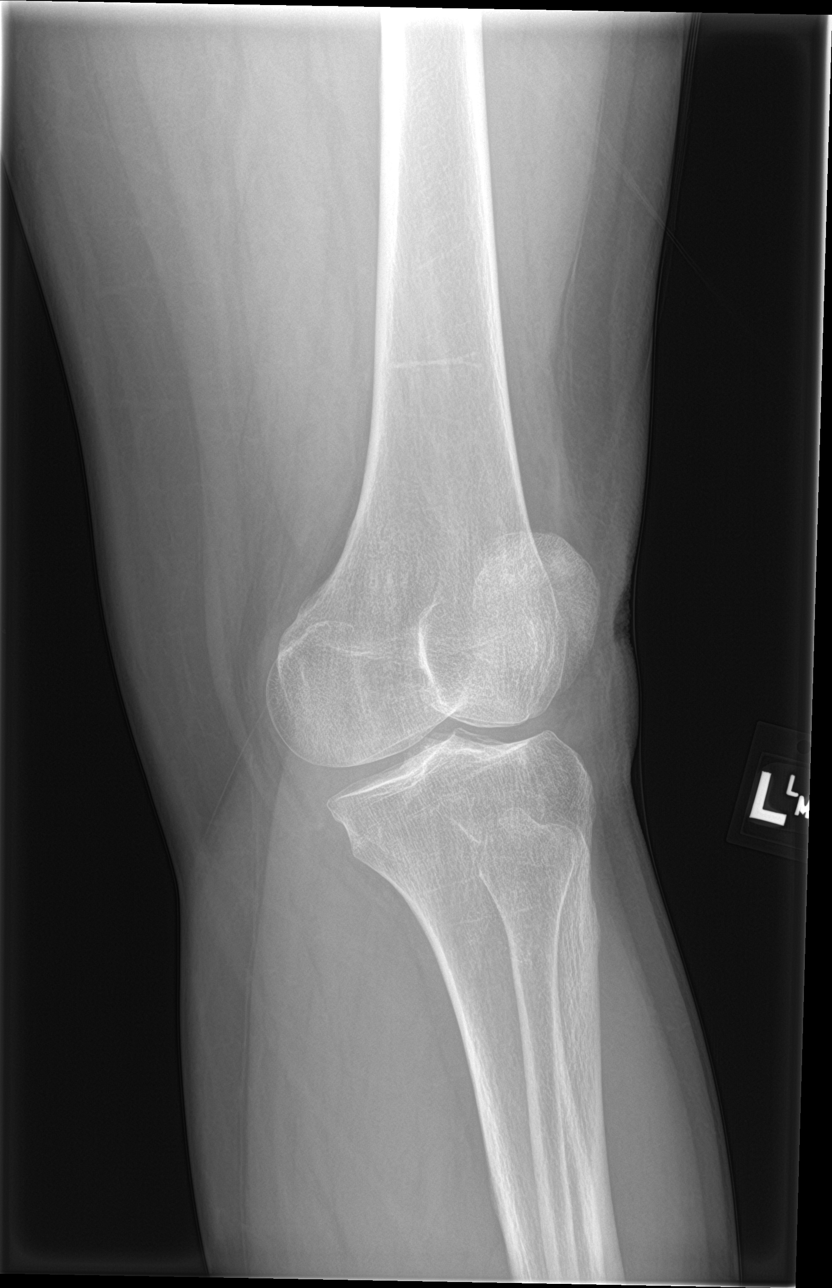
[im 4/4]
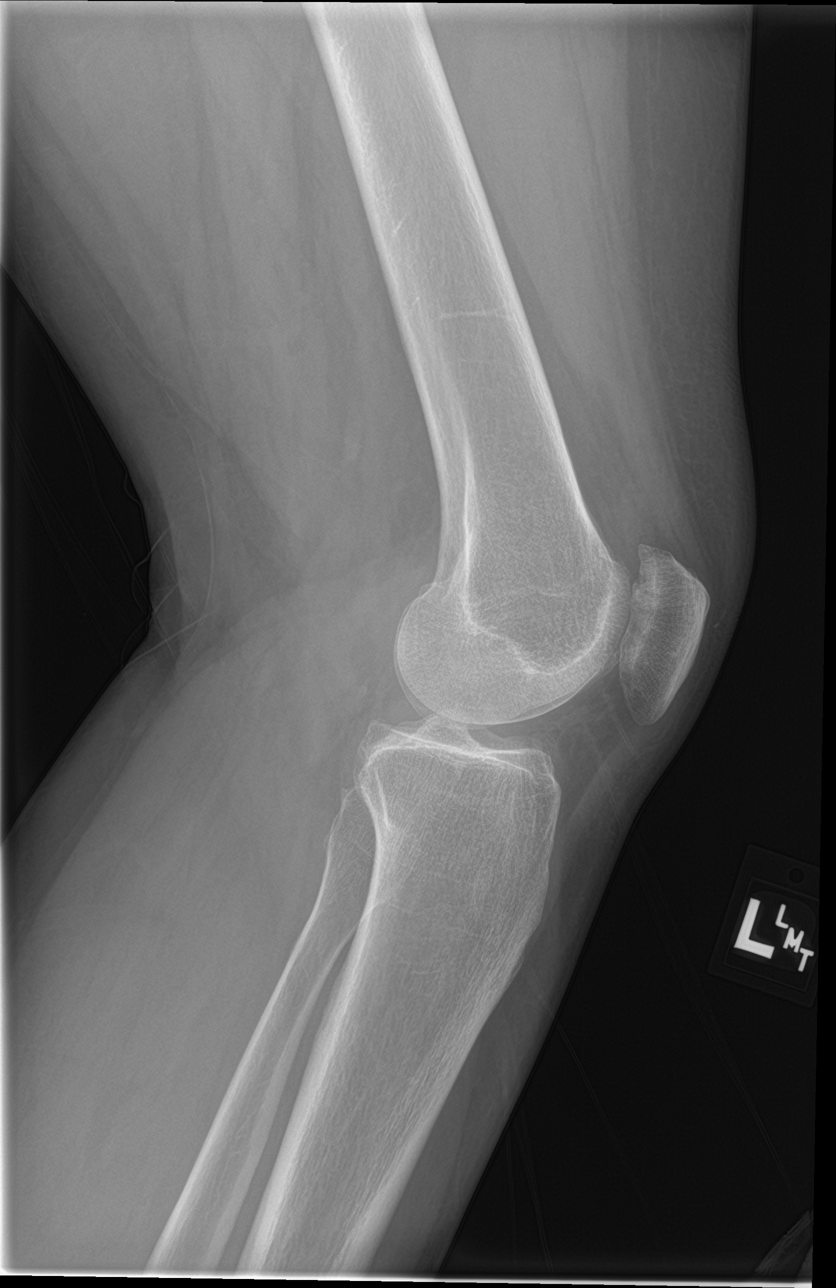

[4 of 4 positions shown; findings below may reference images not displayed]

FINDINGS: No evidence of fracture, dislocation, or joint effusion. Mild medial
tibiofemoral and moderate patellofemoral joint space narrowing with
marginal osteophytes. Small suprapatellar joint effusion. Soft
tissues are unremarkable.
IMPRESSION: 1.  No evidence of fracture or dislocation.

2. Mild medial tibiofemoral and moderate patellofemoral
osteoarthritis with small joint effusion.
